# Patient Record
Sex: Female | Born: 1940 | Race: White | Hispanic: No | Marital: Married | Smoking: Former smoker
Health system: Southern US, Community
[De-identification: ages and names within clinical notes are randomized; demographics above are authoritative.]

## PROBLEM LIST (undated history)

## (undated) DIAGNOSIS — E039 Hypothyroidism, unspecified: Secondary | ICD-10-CM

## (undated) DIAGNOSIS — F1010 Alcohol abuse, uncomplicated: Secondary | ICD-10-CM

## (undated) DIAGNOSIS — Z85820 Personal history of malignant melanoma of skin: Secondary | ICD-10-CM

## (undated) DIAGNOSIS — E782 Mixed hyperlipidemia: Secondary | ICD-10-CM

## (undated) DIAGNOSIS — M1990 Unspecified osteoarthritis, unspecified site: Secondary | ICD-10-CM

## (undated) DIAGNOSIS — N183 Chronic kidney disease, stage 3 unspecified: Secondary | ICD-10-CM

## (undated) DIAGNOSIS — I872 Venous insufficiency (chronic) (peripheral): Secondary | ICD-10-CM

## (undated) DIAGNOSIS — C439 Malignant melanoma of skin, unspecified: Secondary | ICD-10-CM

## (undated) DIAGNOSIS — L659 Nonscarring hair loss, unspecified: Secondary | ICD-10-CM

## (undated) DIAGNOSIS — K589 Irritable bowel syndrome without diarrhea: Secondary | ICD-10-CM

## (undated) DIAGNOSIS — I10 Essential (primary) hypertension: Secondary | ICD-10-CM

## (undated) DIAGNOSIS — T7840XA Allergy, unspecified, initial encounter: Secondary | ICD-10-CM

## (undated) HISTORY — DX: Hypothyroidism, unspecified: E03.9

## (undated) HISTORY — DX: Alcohol abuse, uncomplicated: F10.10

## (undated) HISTORY — DX: Venous insufficiency (chronic) (peripheral): I87.2

## (undated) HISTORY — DX: Malignant melanoma of skin, unspecified: C43.9

## (undated) HISTORY — DX: Mixed hyperlipidemia: E78.2

## (undated) HISTORY — DX: Essential (primary) hypertension: I10

## (undated) HISTORY — DX: Nonscarring hair loss, unspecified: L65.9

## (undated) HISTORY — DX: Chronic kidney disease, stage 3 unspecified: N18.30

## (undated) HISTORY — DX: Personal history of malignant melanoma of skin: Z85.820

## (undated) HISTORY — DX: Unspecified osteoarthritis, unspecified site: M19.90

## (undated) HISTORY — DX: Allergy, unspecified, initial encounter: T78.40XA

## (undated) HISTORY — PX: ABDOMINAL HYSTERECTOMY: SHX81

## (undated) HISTORY — DX: Irritable bowel syndrome, unspecified: K58.9

---

## 1997-11-21 DIAGNOSIS — A770 Spotted fever due to Rickettsia rickettsii: Secondary | ICD-10-CM

## 1997-11-21 HISTORY — DX: Spotted fever due to Rickettsia rickettsii: A77.0

## 1998-11-21 DIAGNOSIS — M8580 Other specified disorders of bone density and structure, unspecified site: Secondary | ICD-10-CM

## 1998-11-21 HISTORY — DX: Other specified disorders of bone density and structure, unspecified site: M85.80

## 1999-11-26 ENCOUNTER — Other Ambulatory Visit: Admission: RE | Admit: 1999-11-26 | Discharge: 1999-11-26 | Payer: Self-pay | Admitting: *Deleted

## 2001-01-08 ENCOUNTER — Other Ambulatory Visit: Admission: RE | Admit: 2001-01-08 | Discharge: 2001-01-08 | Payer: Self-pay | Admitting: *Deleted

## 2005-11-21 DIAGNOSIS — K219 Gastro-esophageal reflux disease without esophagitis: Secondary | ICD-10-CM

## 2005-11-21 HISTORY — DX: Gastro-esophageal reflux disease without esophagitis: K21.9

## 2009-03-11 ENCOUNTER — Ambulatory Visit: Payer: Self-pay | Admitting: Family Medicine

## 2009-03-18 ENCOUNTER — Encounter: Admission: RE | Admit: 2009-03-18 | Discharge: 2009-03-18 | Payer: Self-pay | Admitting: Family Medicine

## 2009-04-16 ENCOUNTER — Ambulatory Visit: Payer: Self-pay | Admitting: Family Medicine

## 2009-07-20 ENCOUNTER — Ambulatory Visit: Payer: Self-pay | Admitting: Family Medicine

## 2011-03-24 ENCOUNTER — Encounter: Payer: Self-pay | Admitting: Family Medicine

## 2011-03-24 DIAGNOSIS — IMO0002 Reserved for concepts with insufficient information to code with codable children: Secondary | ICD-10-CM

## 2011-05-15 ENCOUNTER — Emergency Department (HOSPITAL_COMMUNITY): Payer: Medicare Other

## 2011-05-15 ENCOUNTER — Emergency Department (HOSPITAL_COMMUNITY)
Admission: EM | Admit: 2011-05-15 | Discharge: 2011-05-15 | Disposition: A | Discharge status: Home / Self Care | Payer: Medicare Other | Attending: Emergency Medicine | Admitting: Emergency Medicine

## 2011-05-15 DIAGNOSIS — IMO0001 Reserved for inherently not codable concepts without codable children: Secondary | ICD-10-CM | POA: Insufficient documentation

## 2011-05-15 DIAGNOSIS — R229 Localized swelling, mass and lump, unspecified: Secondary | ICD-10-CM | POA: Insufficient documentation

## 2011-05-15 DIAGNOSIS — S61509A Unspecified open wound of unspecified wrist, initial encounter: Secondary | ICD-10-CM | POA: Insufficient documentation

## 2011-05-15 DIAGNOSIS — Z23 Encounter for immunization: Secondary | ICD-10-CM | POA: Insufficient documentation

## 2011-05-15 DIAGNOSIS — E039 Hypothyroidism, unspecified: Secondary | ICD-10-CM | POA: Insufficient documentation

## 2011-05-15 DIAGNOSIS — I10 Essential (primary) hypertension: Secondary | ICD-10-CM | POA: Insufficient documentation

## 2012-09-12 ENCOUNTER — Other Ambulatory Visit: Payer: Self-pay | Admitting: Family Medicine

## 2012-09-12 ENCOUNTER — Ambulatory Visit
Admission: RE | Admit: 2012-09-12 | Discharge: 2012-09-12 | Disposition: A | Discharge status: Home / Self Care | Payer: Medicare Other | Source: Ambulatory Visit | Attending: Family Medicine | Admitting: Family Medicine

## 2012-09-12 DIAGNOSIS — R52 Pain, unspecified: Secondary | ICD-10-CM

## 2012-09-12 DIAGNOSIS — R202 Paresthesia of skin: Secondary | ICD-10-CM

## 2012-11-21 DIAGNOSIS — G5603 Carpal tunnel syndrome, bilateral upper limbs: Secondary | ICD-10-CM

## 2012-11-21 HISTORY — DX: Carpal tunnel syndrome, bilateral upper limbs: G56.03

## 2013-11-21 DIAGNOSIS — G629 Polyneuropathy, unspecified: Secondary | ICD-10-CM

## 2013-11-21 DIAGNOSIS — J449 Chronic obstructive pulmonary disease, unspecified: Secondary | ICD-10-CM

## 2013-11-21 DIAGNOSIS — F419 Anxiety disorder, unspecified: Secondary | ICD-10-CM

## 2013-11-21 HISTORY — DX: Polyneuropathy, unspecified: G62.9

## 2013-11-21 HISTORY — DX: Chronic obstructive pulmonary disease, unspecified: J44.9

## 2013-11-21 HISTORY — DX: Anxiety disorder, unspecified: F41.9

## 2014-11-21 DIAGNOSIS — F32A Depression, unspecified: Secondary | ICD-10-CM

## 2014-11-21 HISTORY — DX: Depression, unspecified: F32.A

## 2016-05-16 ENCOUNTER — Other Ambulatory Visit: Payer: Self-pay | Admitting: Family Medicine

## 2016-05-16 DIAGNOSIS — R519 Headache, unspecified: Secondary | ICD-10-CM

## 2016-05-16 DIAGNOSIS — R0600 Dyspnea, unspecified: Secondary | ICD-10-CM

## 2016-05-16 DIAGNOSIS — R51 Headache: Secondary | ICD-10-CM

## 2016-05-20 ENCOUNTER — Ambulatory Visit
Admission: RE | Admit: 2016-05-20 | Discharge: 2016-05-20 | Disposition: A | Discharge status: Home / Self Care | Payer: Medicare Other | Source: Ambulatory Visit | Attending: Family Medicine | Admitting: Family Medicine

## 2016-05-20 DIAGNOSIS — R0600 Dyspnea, unspecified: Secondary | ICD-10-CM

## 2016-05-20 DIAGNOSIS — R519 Headache, unspecified: Secondary | ICD-10-CM

## 2016-05-20 DIAGNOSIS — R51 Headache: Secondary | ICD-10-CM

## 2016-05-20 MED ORDER — GADOBENATE DIMEGLUMINE 529 MG/ML IV SOLN
15.0000 mL | Freq: Once | INTRAVENOUS | Status: AC | PRN
  Administered 2016-05-20: 15 mL via INTRAVENOUS

## 2017-02-01 ENCOUNTER — Ambulatory Visit (INDEPENDENT_AMBULATORY_CARE_PROVIDER_SITE_OTHER): Payer: Medicare Other

## 2017-02-01 ENCOUNTER — Ambulatory Visit (INDEPENDENT_AMBULATORY_CARE_PROVIDER_SITE_OTHER): Payer: Medicare Other | Admitting: Podiatry

## 2017-02-01 VITALS — BP 132/95 | HR 78

## 2017-02-01 DIAGNOSIS — M722 Plantar fascial fibromatosis: Secondary | ICD-10-CM

## 2017-02-01 DIAGNOSIS — M21619 Bunion of unspecified foot: Secondary | ICD-10-CM | POA: Diagnosis not present

## 2017-02-01 NOTE — Patient Instructions (Signed)
Hallux Rigidus Hallux rigidus is a type of joint pain or joint disease (arthritis) that affects your big toe (hallux). This condition involves the joint that connects the base of your big toe to the main part of your foot (metatarsophalangeal joint). This condition can cause your big toe to become stiff, painful, and difficult to move. Symptoms may get worse with movement or in cold or damp weather. The condition also gets worse over time. What are the causes? This condition may be caused by having a foot that does not function the way that it should or has an abnormal shape (structural deformity). These foot problems can run in families (be hereditary). This condition can also be caused by:  Injury.  Overuse.  Certain inflammatory diseases, including gout and rheumatoid arthritis. What increases the risk? This condition is more likely to develop in people who:  Have a foot bone (metatarsal) that is longer or higher than normal.  Have a family history of hallux rigidus.  Have previously injured their big toe.  Have feet that do not have a curve (arch) on the inner side of the foot. This may be called flat feet or fallen arches.  Turn their ankles in when they walk (pronation).  Have rheumatoid arthritis or gout.  Have to stoop down often at work. What are the signs or symptoms? Symptoms of this condition include:  Big toe pain.  Stiffness and difficulty moving the big toe.  Swelling of the toe and surrounding area.  Bone spurs. These are bony growths that can form on the joint of the big toe.  A limp. How is this diagnosed? This condition is diagnosed based on a medical history and physical exam. This may include X-rays. How is this treated? Treatment for this condition includes:  Wearing roomy, comfortable shoes that have a large toe box.  Putting orthotic devices in your shoes.  Pain medicines.  Physical therapy.  Icing the injured area.  Alternate between  putting your foot in cold water then warm water. If your condition is severe, treatment may include:  Corticosteroid injections to relieve pain.  Surgery to remove bone spurs, fuse damaged bones together, or replace the entire joint. Follow these instructions at home:  Take over-the-counter and prescription medicines only as told by your health care provider.  Do not wear high heels or other restrictive footwear. Wear comfortable, supportive shoes that have a large toe box.  Wear orthotics as told by your health care provider, if this applies.  Put your feet in cold water for 30 seconds, then in warm water for 30 seconds. Alternate between the cold and warm water for 5 minutes. Do this several times a day or as told by your health care provider.  If directed, apply ice to the injured area.  Put ice in a plastic bag.  Place a towel between your skin and the bag.  Leave the ice on for 20 minutes, 2-3 times per day.  Do foot exercises as instructed by your health care provider or a physical therapist.  Keep all follow-up visits as told by your health care provider. This is important. Contact a health care provider if:  You notice bone spurs or growths on or around your big toe.  Your pain does not get better or it gets worse.  You have pain while resting.  You have pain in other parts of your body, such as your back, hip, or knee.  You start to limp. This information is not intended  to replace advice given to you by your health care provider. Make sure you discuss any questions you have with your health care provider. Document Released: 11/07/2005 Document Revised: 04/14/2016 Document Reviewed: 07/15/2015 [employer] Interactive Patient Education  2017 [employer]  Plantar Fasciitis (Heel Spur Syndrome) with Rehab The plantar fascia is a fibrous, ligament-like, soft-tissue structure that spans the bottom of the foot. Plantar fasciitis is a condition that causes pain in the  foot due to inflammation of the tissue. SYMPTOMS   Pain and tenderness on the underneath side of the foot.  Pain that worsens with standing or walking. CAUSES  Plantar fasciitis is caused by irritation and injury to the plantar fascia on the underneath side of the foot. Common mechanisms of injury include:  Direct trauma to bottom of the foot.  Damage to a small nerve that runs under the foot where the main fascia attaches to the heel bone.  Stress placed on the plantar fascia due to bone spurs. RISK INCREASES WITH:   Activities that place stress on the plantar fascia (running, jumping, pivoting, or cutting).  Poor strength and flexibility.  Improperly fitted shoes.  Tight calf muscles.  Flat feet.  Failure to warm-up properly before activity.  Obesity. PREVENTION  Warm up and stretch properly before activity.  Allow for adequate recovery between workouts.  Maintain physical fitness:  Strength, flexibility, and endurance.  Cardiovascular fitness.  Maintain a health body weight.  Avoid stress on the plantar fascia.  Wear properly fitted shoes, including arch supports for individuals who have flat feet.  PROGNOSIS  If treated properly, then the symptoms of plantar fasciitis usually resolve without surgery. However, occasionally surgery is necessary.  RELATED COMPLICATIONS   Recurrent symptoms that may result in a chronic condition.  Problems of the lower back that are caused by compensating for the injury, such as limping.  Pain or weakness of the foot during push-off following surgery.  Chronic inflammation, scarring, and partial or complete fascia tear, occurring more often from repeated injections.  TREATMENT  Treatment initially involves the use of ice and medication to help reduce pain and inflammation. The use of strengthening and stretching exercises may help reduce pain with activity, especially stretches of the Achilles tendon. These exercises may  be performed at home or with a therapist. Your caregiver may recommend that you use heel cups of arch supports to help reduce stress on the plantar fascia. Occasionally, corticosteroid injections are given to reduce inflammation. If symptoms persist for greater than 6 months despite non-surgical (conservative), then surgery may be recommended.   MEDICATION   If pain medication is necessary, then nonsteroidal anti-inflammatory medications, such as aspirin and ibuprofen, or other minor pain relievers, such as acetaminophen, are often recommended.  Do not take pain medication within 7 days before surgery.  Prescription pain relievers may be given if deemed necessary by your caregiver. Use only as directed and only as much as you need.  Corticosteroid injections may be given by your caregiver. These injections should be reserved for the most serious cases, because they may only be given a certain number of times.  HEAT AND COLD  Cold treatment (icing) relieves pain and reduces inflammation. Cold treatment should be applied for 10 to 15 minutes every 2 to 3 hours for inflammation and pain and immediately after any activity that aggravates your symptoms. Use ice packs or massage the area with a piece of ice (ice massage).  Heat treatment may be used prior to performing the stretching  and strengthening activities prescribed by your caregiver, physical therapist, or athletic trainer. Use a heat pack or soak the injury in warm water.  SEEK IMMEDIATE MEDICAL CARE IF:  Treatment seems to offer no benefit, or the condition worsens.  Any medications produce adverse side effects.  EXERCISES- RANGE OF MOTION (ROM) AND STRETCHING EXERCISES - Plantar Fasciitis (Heel Spur Syndrome) These exercises may help you when beginning to rehabilitate your injury. Your symptoms may resolve with or without further involvement from your physician, physical therapist or athletic trainer. While completing these exercises,  remember:   Restoring tissue flexibility helps normal motion to return to the joints. This allows healthier, less painful movement and activity.  An effective stretch should be held for at least 30 seconds.  A stretch should never be painful. You should only feel a gentle lengthening or release in the stretched tissue.  RANGE OF MOTION - Toe Extension, Flexion  Sit with your right / left leg crossed over your opposite knee.  Grasp your toes and gently pull them back toward the top of your foot. You should feel a stretch on the bottom of your toes and/or foot.  Hold this stretch for 10 seconds.  Now, gently pull your toes toward the bottom of your foot. You should feel a stretch on the top of your toes and or foot.  Hold this stretch for 10 seconds. Repeat  times. Complete this stretch 3 times per day.   RANGE OF MOTION - Ankle Dorsiflexion, Active Assisted  Remove shoes and sit on a chair that is preferably not on a carpeted surface.  Place right / left foot under knee. Extend your opposite leg for support.  Keeping your heel down, slide your right / left foot back toward the chair until you feel a stretch at your ankle or calf. If you do not feel a stretch, slide your bottom forward to the edge of the chair, while still keeping your heel down.  Hold this stretch for 10 seconds. Repeat 3 times. Complete this stretch 2 times per day.   STRETCH  Gastroc, Standing  Place hands on wall.  Extend right / left leg, keeping the front knee somewhat bent.  Slightly point your toes inward on your back foot.  Keeping your right / left heel on the floor and your knee straight, shift your weight toward the wall, not allowing your back to arch.  You should feel a gentle stretch in the right / left calf. Hold this position for 10 seconds. Repeat 3 times. Complete this stretch 2 times per day.  STRETCH  Soleus, Standing  Place hands on wall.  Extend right / left leg, keeping the other  knee somewhat bent.  Slightly point your toes inward on your back foot.  Keep your right / left heel on the floor, bend your back knee, and slightly shift your weight over the back leg so that you feel a gentle stretch deep in your back calf.  Hold this position for 10 seconds. Repeat 3 times. Complete this stretch 2 times per day.  STRETCH  Gastrocsoleus, Standing  Note: This exercise can place a lot of stress on your foot and ankle. Please complete this exercise only if specifically instructed by your caregiver.   Place the ball of your right / left foot on a step, keeping your other foot firmly on the same step.  Hold on to the wall or a rail for balance.  Slowly lift your other foot, allowing your body  weight to press your heel down over the edge of the step.  You should feel a stretch in your right / left calf.  Hold this position for 10 seconds.  Repeat this exercise with a slight bend in your right / left knee. Repeat 3 times. Complete this stretch 2 times per day.   STRENGTHENING EXERCISES - Plantar Fasciitis (Heel Spur Syndrome)  These exercises may help you when beginning to rehabilitate your injury. They may resolve your symptoms with or without further involvement from your physician, physical therapist or athletic trainer. While completing these exercises, remember:   Muscles can gain both the endurance and the strength needed for everyday activities through controlled exercises.  Complete these exercises as instructed by your physician, physical therapist or athletic trainer. Progress the resistance and repetitions only as guided.  STRENGTH - Towel Curls  Sit in a chair positioned on a non-carpeted surface.  Place your foot on a towel, keeping your heel on the floor.  Pull the towel toward your heel by only curling your toes. Keep your heel on the floor. Repeat 3 times. Complete this exercise 2 times per day.  STRENGTH - Ankle Inversion  Secure one end of a  rubber exercise band/tubing to a fixed object (table, pole). Loop the other end around your foot just before your toes.  Place your fists between your knees. This will focus your strengthening at your ankle.  Slowly, pull your big toe up and in, making sure the band/tubing is positioned to resist the entire motion.  Hold this position for 10 seconds.  Have your muscles resist the band/tubing as it slowly pulls your foot back to the starting position. Repeat 3 times. Complete this exercises 2 times per day.  Document Released: 11/07/2005 Document Revised: 01/30/2012 Document Reviewed: 02/19/2009 [facility] Patient Information 2014 [place], [place].

## 2017-02-03 NOTE — Progress Notes (Signed)
Subjective:     Patient ID: Yesenia Washington, female   DOB: [birth year], 76 y.o.   MRN: [id number]  HPI patient presents with painful heel pain and also has structural bunion deformity. States it's been present for several months and is getting worse and the bunions been present for a long time and she's tried wider shoes and soaks   Review of Systems  All other systems reviewed and are negative.      Objective:   Physical Exam  Constitutional: She is oriented to person, place, and time.  Cardiovascular: Intact distal pulses.   Musculoskeletal: Normal range of motion.  Neurological: She is oriented to person, place, and time.  Skin: Skin is warm.  Nursing note and vitals reviewed.  neurovascular status intact muscle strength adequate with patient found have structural bunion deformity with pain and redness and also is noted to have exquisite discomfort in the heel at the insertional point tendon into the calcaneus. Patient is moderate flatfoot deformity does have good digital perfusion and is well oriented 3     Assessment:     Inflammatory fasciitis with bunion deformity noted    Plan:     H&P both conditions discussed and education rendered. Reviewed x-rays and today I injected the plantar fascia 3 mg Kenalog 5 mg Xylocaine and applied fascial brace with instructions on usage. Gave instructions on physical therapy and also shoe gear modifications and discussed possible bunion correction  X-ray report indicates that there is elevation between the first and second metatarsal of approximate 14 with patient noted to have small spur formation plantar heel with no indication stress fracture advanced arthritis

## 2017-02-13 ENCOUNTER — Encounter: Payer: Self-pay | Admitting: Podiatry

## 2017-02-13 ENCOUNTER — Ambulatory Visit (INDEPENDENT_AMBULATORY_CARE_PROVIDER_SITE_OTHER): Payer: Medicare Other | Admitting: Podiatry

## 2017-02-13 DIAGNOSIS — M722 Plantar fascial fibromatosis: Secondary | ICD-10-CM

## 2017-02-13 DIAGNOSIS — M2040 Other hammer toe(s) (acquired), unspecified foot: Secondary | ICD-10-CM

## 2017-02-13 DIAGNOSIS — M21619 Bunion of unspecified foot: Secondary | ICD-10-CM

## 2017-02-13 MED ORDER — ONDANSETRON HCL 4 MG PO TABS: 4.0000 mg | ORAL_TABLET | Freq: Three times a day (TID) | ORAL | 0 refills | Status: DC | PRN

## 2017-02-13 MED ORDER — OXYCODONE-ACETAMINOPHEN 10-325 MG PO TABS: 1.0000 | ORAL_TABLET | ORAL | 0 refills | Status: DC | PRN

## 2017-02-13 NOTE — Patient Instructions (Signed)

## 2017-02-14 ENCOUNTER — Encounter: Payer: Self-pay | Admitting: Podiatry

## 2017-02-14 DIAGNOSIS — M722 Plantar fascial fibromatosis: Secondary | ICD-10-CM | POA: Diagnosis not present

## 2017-02-14 DIAGNOSIS — M2041 Other hammer toe(s) (acquired), right foot: Secondary | ICD-10-CM | POA: Diagnosis not present

## 2017-02-14 DIAGNOSIS — M2021 Hallux rigidus, right foot: Secondary | ICD-10-CM | POA: Diagnosis not present

## 2017-02-14 NOTE — Progress Notes (Addendum)
In reviewing patient on 02-13-17 I discovered inconsistencies from the note of 314. 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 In reviewing this with her we do agree that a implant arthroplasty will be necessary due to the advancement of condition and the note of 3-14 was not reflective of this. I have also recommended digital fusions. 02-13-17  patient presents on this date with pain and deformity of the first metatarsal phalangeal joint with severe spurring formation and complete loss of motion with pain around the entire joint surface. Patient did have previous failed surgery on the first metatarsal and also presents with significant digital deformities of digits 23 with elevation redness of the proximal phalanx and failure to respond to trimming and padding. Pain in the heel present but improved. 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 I at this time discussed treatment options and she wants surgery and I allowed her to read consent form going over alternative treatments complications and the fact that this is revisional surgery and that there is no long-term guarantees and that she does have severe deformity. Patient wants surgery and after extensive review signs of understanding all complications and difficulty of procedure and will have a total implant arthroplasty along with digital fusion of digits 2 and 3 right with external K wire an injection of the right heel. Patient has air fracture walker dispensed with all instructions on usage and is scheduled for outpatient surgery and is encouraged to call with questions.   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  In reviewing this note it was noted that while she has severe progressive arthritis and she had not had previous surgery on the first MPJ.  Patient did have progressive deformity with narrowing of the joint surface advanced arthritis and spur formation but absence of a previous surgical procedure on the first metatarsal right foot

## 2017-02-15 ENCOUNTER — Telehealth: Payer: Self-pay | Admitting: *Deleted

## 2017-02-15 NOTE — Telephone Encounter (Signed)
Pt states she did not have ice packs in her surgery bag, and did not get from surgical center. I spoke with pt she is using frozen corn, but is not certain getting proper icing. I told pt that our ice packs were gel but had the same cooling properties as the frozen corn and to place behind the knee.I offered pt to have her husband pick up 2 ice packs in the [place] office, and she states she doesn't want to inconvenience him further. Pt states she is placing on the side of the knee because that is the side of the surgery foot that hurts. I told her that was fine and instructed her to not be weight bearing or dangle the foot more than 15 mins/hour.

## 2017-02-24 ENCOUNTER — Ambulatory Visit (INDEPENDENT_AMBULATORY_CARE_PROVIDER_SITE_OTHER): Payer: Self-pay | Admitting: Podiatry

## 2017-02-24 ENCOUNTER — Ambulatory Visit (INDEPENDENT_AMBULATORY_CARE_PROVIDER_SITE_OTHER): Payer: Medicare Other

## 2017-02-24 DIAGNOSIS — M2040 Other hammer toe(s) (acquired), unspecified foot: Secondary | ICD-10-CM

## 2017-02-24 DIAGNOSIS — M21619 Bunion of unspecified foot: Secondary | ICD-10-CM

## 2017-02-24 MED ORDER — OXYCODONE-ACETAMINOPHEN 10-325 MG PO TABS: 1.0000 | ORAL_TABLET | ORAL | 0 refills | Status: DC | PRN

## 2017-02-25 NOTE — Progress Notes (Signed)
Subjective: Yesenia Washington is a 76 y.o. is seen today in office s/p right Jake Michaelis bunion with implant and hammertoe repair preformed on 02/14/17. They state their pain is improving but she is still taking percocet as needed. She is still wearing the CAM boot and is asking to come out of it today. Denies any systemic complaints such as fevers, chills, nausea, vomiting. No calf pain, chest pain, shortness of breath.   Objective: General: No acute distress, AAOx3  DP/PT pulses palpable 2/4, CRT < 3 sec to all digits.  Protective sensation intact. Motor function intact.  Right foot: Incision is well coapted without any evidence of dehiscence and suture intact. There is no surrounding erythema, ascending cellulitis, fluctuance, crepitus, malodor, drainage/purulence. There is mild edema around the surgical site. There is mild pain along the surgical site. Toes are in a rectus position. There are no clinical signs of infection.  No other areas of tenderness to bilateral lower extremities.  No other open lesions or pre-ulcerative lesions.  No pain with calf compression, swelling, warmth, erythema.   Assessment and Plan:  Status post right foot surgery, doing well with no complications   -Treatment options discussed including all alternatives, risks, and complications -X-rays were obtained and reviewed with the patient. Status post first MPJ arthroplasty with implant. Implant is not seated flush and appears to be penetrating the cortex of the phalanx however it appears to be intact. -Recommend continue with the cam boot at all times. She was disappointed today about this however encouraged her stay in the cam boot. -Ice/elevation -Pain medication as needed. Refilled today. -Monitor for any clinical signs or symptoms of infection and DVT/PE and directed to call the office immediately should any occur or go to the ER. -Follow-up in 1 week or sooner if any problems arise. In the meantime, encouraged to call  the office with any questions, concerns, change in symptoms.   Celesta Gentile, DPM

## 2017-03-03 ENCOUNTER — Ambulatory Visit (INDEPENDENT_AMBULATORY_CARE_PROVIDER_SITE_OTHER): Payer: Medicare Other

## 2017-03-03 ENCOUNTER — Ambulatory Visit (INDEPENDENT_AMBULATORY_CARE_PROVIDER_SITE_OTHER): Payer: Medicare Other | Admitting: Podiatry

## 2017-03-03 DIAGNOSIS — M21619 Bunion of unspecified foot: Secondary | ICD-10-CM

## 2017-03-03 DIAGNOSIS — M2040 Other hammer toe(s) (acquired), unspecified foot: Secondary | ICD-10-CM | POA: Diagnosis not present

## 2017-03-05 NOTE — Progress Notes (Signed)
Subjective:     Patient ID: Yesenia Washington, female   DOB: 16-Mar-1941, 76 y.o.   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 Also had had digital fusion digits 23 with the second pin moving in a distal direction    Assessment:     Doing well clinically postoperatively with movement of the second pin with good range of motion first MPJ with no crepitus or pain noted    Plan:     H&P x-ray reviewed and I went ahead and remove the pin from the second digit and applied Band-Aid was sterile dressing to the end of the toe. I reviewed with her the fact that there has been some movement of the implant but I'm relatively confident will function well in the position it is and I will just have to watch it with the possibility may need to be removed in future and realigned or other procedure done but right now clinically she is functioning very well  X-rays indicate that there has been some malposition of the implant on the distal side due to the sclerosis of her bone but I do believe clinically is functioning well I don't think it's given to give her any issues but we'll need to be watched

## 2017-03-17 ENCOUNTER — Ambulatory Visit (INDEPENDENT_AMBULATORY_CARE_PROVIDER_SITE_OTHER): Payer: Medicare Other | Admitting: Podiatry

## 2017-03-17 ENCOUNTER — Encounter: Payer: Self-pay | Admitting: Podiatry

## 2017-03-17 ENCOUNTER — Ambulatory Visit (INDEPENDENT_AMBULATORY_CARE_PROVIDER_SITE_OTHER): Payer: Medicare Other

## 2017-03-17 VITALS — Resp 16

## 2017-03-17 DIAGNOSIS — M21619 Bunion of unspecified foot: Secondary | ICD-10-CM | POA: Diagnosis not present

## 2017-03-17 DIAGNOSIS — M2040 Other hammer toe(s) (acquired), unspecified foot: Secondary | ICD-10-CM

## 2017-03-20 NOTE — Progress Notes (Signed)
Subjective:    Patient ID: Yesenia Washington, female   DOB: 76 y.o.   MRN: [id number]   HPI patient states that she's not having a lot of pain but she like to get this pin removed in the third toe and she gets some swelling in the second toe    ROS      Objective:  Physical Exam Neurovascular status intact negative Homan sign was noted with wound edges well coapted hallux second third toes right with crusted tissue on the second toe which is bothersome to her. Pin is in place third digit and range of motion of the first MPJ is good    Assessment:    Doing well post implant right first MPJ despite the fact the bone was very hard and I was unable to get a press-fit and good alignment of the second and third toes     Plan:    Pin removed third digit right sterile dressing applied removed scab tissue from the second digit which gave her relief and discussed continued range of motion exercises and that we'll have to continue to monitor the first MPJ  X-rays indicate that there is some looseness of the first implant first MPJ but I do think it will function fine at this position and over time should heal in and I do not believe it will give her any long-term problems. Second and third toes with good and she will be monitored closely over the next several months

## 2017-03-31 NOTE — Progress Notes (Unsigned)
DOS 02/14/2017 Bunionectomy with implant procedure 1st right MPT: Fusion with pin 2nd and 3rd toes right: injection right heel

## 2017-04-28 ENCOUNTER — Encounter: Payer: Self-pay | Admitting: Podiatry

## 2017-04-28 ENCOUNTER — Ambulatory Visit (INDEPENDENT_AMBULATORY_CARE_PROVIDER_SITE_OTHER): Payer: Self-pay | Admitting: Podiatry

## 2017-04-28 ENCOUNTER — Ambulatory Visit (INDEPENDENT_AMBULATORY_CARE_PROVIDER_SITE_OTHER): Payer: Medicare Other

## 2017-04-28 ENCOUNTER — Ambulatory Visit: Payer: Medicare Other

## 2017-04-28 DIAGNOSIS — M21619 Bunion of unspecified foot: Secondary | ICD-10-CM

## 2017-04-28 NOTE — Progress Notes (Signed)
Subjective:    Patient ID: Yesenia Washington, female   DOB: 76 y.o.   MRN: [id number]   HPI patient states the big toes doing great and she's got some numbness in the second and third toes with occasional discomfort    ROS      Objective:  Physical Exam neurovascular status intact with adequate motion of the first MPJ right with no crepitus of the joint and the second and third digits and good alignment with mild elevation second toes the third toe and hallux do deviate slightly towards     Assessment:    Overall doing fine but is having some irritation of the second digit which should be short-term     Plan:   H&P applied padding to the second toe reviewed x-rays and patient can gradually increase activities and shoe gear and the symptoms should slowly go away over time. Reappoint 8 weeks or earlier if needed  X-ray indicates that the osteotomies are healing well and the implant is seated slightly of position but in a satisfactory position

## 2017-06-28 ENCOUNTER — Encounter: Payer: Self-pay | Admitting: Podiatry

## 2017-06-28 ENCOUNTER — Ambulatory Visit (INDEPENDENT_AMBULATORY_CARE_PROVIDER_SITE_OTHER): Payer: Medicare Other | Admitting: Podiatry

## 2017-06-28 ENCOUNTER — Ambulatory Visit (INDEPENDENT_AMBULATORY_CARE_PROVIDER_SITE_OTHER): Payer: Medicare Other

## 2017-06-28 DIAGNOSIS — M21619 Bunion of unspecified foot: Secondary | ICD-10-CM

## 2017-06-28 DIAGNOSIS — M722 Plantar fascial fibromatosis: Secondary | ICD-10-CM | POA: Diagnosis not present

## 2017-06-28 DIAGNOSIS — M2040 Other hammer toe(s) (acquired), unspecified foot: Secondary | ICD-10-CM

## 2017-06-28 MED ORDER — TRIAMCINOLONE ACETONIDE 10 MG/ML IJ SUSP
10.0000 mg | Freq: Once | INTRAMUSCULAR | Status: AC
Start: 2017-06-28 — End: 2017-06-28
  Administered 2017-06-28: 10 mg

## 2017-06-28 NOTE — Progress Notes (Signed)
Subjective:    Patient ID: Yesenia Washington, female   DOB: 76 y.o.   MRN: [id number]   HPI patient states she's feeling quite a bit better but still getting some pain in the bottom of her heel on the outside and she is due to go to [place] walking    ROS      Objective:  Physical Exam neurovascular status intact with patient doing well on the first MPJ right with no pain with second and third digits healing well with mild edema in the second digit but overall continues to improve with pain in the plantar heel lateral side     Assessment:   Doing well post surgery right foot with mild edema still noted with fasciitis right     Plan:    H&P condition reviewed and careful lateral injection administered 3 mg Kenalog 5 mill grams Xylocaine and instructed on physical therapy. Reappoint to recheck again when she returns from her trip  X-rays indicate the implant is not in perfect position but it's functioning well where it is and the digits are healing well

## 2017-08-31 ENCOUNTER — Ambulatory Visit (INDEPENDENT_AMBULATORY_CARE_PROVIDER_SITE_OTHER): Payer: Medicare Other | Admitting: Podiatry

## 2017-08-31 ENCOUNTER — Encounter: Payer: Self-pay | Admitting: Podiatry

## 2017-08-31 ENCOUNTER — Ambulatory Visit (INDEPENDENT_AMBULATORY_CARE_PROVIDER_SITE_OTHER): Payer: Medicare Other

## 2017-08-31 DIAGNOSIS — M779 Enthesopathy, unspecified: Secondary | ICD-10-CM | POA: Diagnosis not present

## 2017-08-31 DIAGNOSIS — M21619 Bunion of unspecified foot: Secondary | ICD-10-CM

## 2017-08-31 NOTE — Progress Notes (Signed)
Subjective:    Patient ID: Yesenia Washington, female   DOB: 76 y.o.   MRN: [id number]   HPI patient states she is improving but she still can get some swelling and pain in her foot but admits she's not been compressing    ROS      Objective:  Physical Exam neurovascular status intact with continued edema in the right forefoot with mild swelling noted with digits actually doing much better with diminished redness and first MPJ range of motion good with no pain or crepitus when moved     Assessment:    Continuing to have moderate inflammatory condition but it is improving and she has not been wearing compression     Plan:    Dispensed ankle compression stocking and instructed on importance of this and also elevation and rigid shoe usage. Patient will be seen back as needed  X-ray indicates that the implant is in place toes are recently good alignment with no indications of other pathology

## 2017-09-06 ENCOUNTER — Ambulatory Visit: Payer: Medicare Other | Admitting: Podiatry

## 2017-12-01 ENCOUNTER — Ambulatory Visit (INDEPENDENT_AMBULATORY_CARE_PROVIDER_SITE_OTHER): Payer: Medicare Other

## 2017-12-01 ENCOUNTER — Encounter: Payer: Self-pay | Admitting: Podiatry

## 2017-12-01 ENCOUNTER — Ambulatory Visit: Payer: Medicare Other | Admitting: Podiatry

## 2017-12-01 DIAGNOSIS — M2011 Hallux valgus (acquired), right foot: Secondary | ICD-10-CM | POA: Diagnosis not present

## 2017-12-01 DIAGNOSIS — M779 Enthesopathy, unspecified: Secondary | ICD-10-CM | POA: Diagnosis not present

## 2017-12-01 MED ORDER — TRIAMCINOLONE ACETONIDE 10 MG/ML IJ SUSP
10.0000 mg | Freq: Once | INTRAMUSCULAR | Status: AC
  Administered 2017-12-01: 10 mg

## 2017-12-01 NOTE — Progress Notes (Signed)
Subjective:   Patient ID: Yesenia Washington, female   DOB: 77 y.o.   MRN: [id number]   HPI Patient presents concerned about pain still in her right foot after surgery 9 months ago. Patient states she had a second opinion and she was concerned about her second digit and states that she does not have pain in her big toe joint but feels like it's in her forefoot around the second digit   ROS      Objective:  Physical Exam  Neurovascular status was found to be intact with patient having a well-healed surgical site right first metatarsal and second and third digits. I did not note any excessive erythema edema at the current time and upon palpation I found that the pain is mostly centered around the second metatarsophalangeal joint right and the second toe does have stress on it from the big toe and third toe but does not have excessive elevation at the current time or inflammatory process     Assessment:  Based on her presentation today it appears her pain is more in the second metatarsophalangeal joint versus the interphalangeal joint of the second digit     Plan:  H&P and x-ray reviewed. I did explain that with the implant procedure that you can get excess pressure on the second MPJ which can create inflammation and I would like to focus on this joint at the current time. I today went ahead and I did a proximal nerve block around the second MPJ and then using sterile procedure I aspirated the second MPJ getting out of small amount of clear fluid and injected the second MPJ with a quarter cc of dexamethasone Kenalog and applied thick plantar padding to reduce pressure against the joint surface. I did state at one point in future it may be possible to shorten the second metatarsal depending on response but we may be good with medication and orthotics. Reappoint to recheck again in 2 weeks  X-rays indicate the implant is in satisfactory position and based on this current x-ray it does appear there is  fusion of the second digit right at the interphalangeal joint. I did review findings with Dr. Jacqualyn Posey who concurred and there is some pathology of the second and third metatarsophalangeal joints with medial displacement of the digits

## 2017-12-05 ENCOUNTER — Telehealth: Payer: Self-pay | Admitting: Podiatry

## 2017-12-05 NOTE — Telephone Encounter (Signed)
Called pt to let her know that her requested medical records were ready for her to pick up at our front office at her convenience.

## 2017-12-15 ENCOUNTER — Ambulatory Visit: Payer: Medicare Other | Admitting: Podiatry

## 2017-12-25 ENCOUNTER — Ambulatory Visit: Payer: Medicare Other | Admitting: Podiatry

## 2017-12-25 ENCOUNTER — Encounter: Payer: Self-pay | Admitting: Podiatry

## 2017-12-25 DIAGNOSIS — M779 Enthesopathy, unspecified: Secondary | ICD-10-CM

## 2017-12-27 NOTE — Progress Notes (Signed)
Subjective:   Patient ID: Yesenia Washington, female   DOB: 77 y.o.   MRN: [id number]   HPI Patient presents stating that she did have improvement with the medication and it does not seem as bad as it was previously but she is having pain again in her joint.   ROS      Objective:  Physical Exam  Neurovascular status intact with inflammation present at the second metatarsal phalangeal joint with fluid buildup of a mild nature within the joint itself.  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  At this time we are going to make her a orthotic to try to reduce the weightbearing forces on the second MPJ and also I went over wearing his rigid bottom shoes as possible.  Educated her on this and patient will be seen back for casting with Liliane Channel on Thursday

## 2017-12-29 ENCOUNTER — Ambulatory Visit (INDEPENDENT_AMBULATORY_CARE_PROVIDER_SITE_OTHER): Payer: Medicare Other | Admitting: Podiatry

## 2017-12-29 ENCOUNTER — Ambulatory Visit (INDEPENDENT_AMBULATORY_CARE_PROVIDER_SITE_OTHER): Payer: Medicare Other | Admitting: Orthotics

## 2017-12-29 ENCOUNTER — Ambulatory Visit: Payer: Medicare Other | Admitting: Podiatry

## 2017-12-29 DIAGNOSIS — M722 Plantar fascial fibromatosis: Secondary | ICD-10-CM

## 2017-12-29 DIAGNOSIS — M779 Enthesopathy, unspecified: Secondary | ICD-10-CM

## 2017-12-29 DIAGNOSIS — M2011 Hallux valgus (acquired), right foot: Secondary | ICD-10-CM

## 2017-12-31 NOTE — Progress Notes (Signed)
Subjective:   Patient ID: Yesenia Washington, female   DOB: 77 y.o.   MRN: [id number]   HPI Patient presents to see ped orthotist for orthotic scanning   ROS      Objective:  Physical Exam  Continued complaint of inflammation second metatarsal phalangeal joint right     Assessment:  Probable capsulitis second MPJ right     Plan:  Casted for orthotics and discussed the continued importance of more rigid bottom shoes

## 2018-01-14 NOTE — Progress Notes (Signed)
Patient came into today to be cast for [facility]. Upon recommendation of Dr. Paulla Dolly Patient presents with sub 2 capsulitis R Goals are longitudinal support and offload 2 cap R Plan vendor [place]

## 2018-01-23 ENCOUNTER — Other Ambulatory Visit: Payer: Medicare Other

## 2018-02-21 ENCOUNTER — Emergency Department (HOSPITAL_COMMUNITY)
Admission: EM | Admit: 2018-02-21 | Discharge: 2018-02-23 | Disposition: A | Discharge status: Other Acute Inpatient Hospital | Payer: Medicare Other | Attending: Emergency Medicine | Admitting: Emergency Medicine

## 2018-02-21 DIAGNOSIS — F322 Major depressive disorder, single episode, severe without psychotic features: Secondary | ICD-10-CM | POA: Diagnosis not present

## 2018-02-21 DIAGNOSIS — Z046 Encounter for general psychiatric examination, requested by authority: Secondary | ICD-10-CM | POA: Insufficient documentation

## 2018-02-21 DIAGNOSIS — Z01818 Encounter for other preprocedural examination: Secondary | ICD-10-CM

## 2018-02-21 DIAGNOSIS — Z85820 Personal history of malignant melanoma of skin: Secondary | ICD-10-CM | POA: Insufficient documentation

## 2018-02-21 DIAGNOSIS — Z87891 Personal history of nicotine dependence: Secondary | ICD-10-CM | POA: Diagnosis not present

## 2018-02-21 DIAGNOSIS — R45851 Suicidal ideations: Secondary | ICD-10-CM | POA: Insufficient documentation

## 2018-02-21 DIAGNOSIS — E039 Hypothyroidism, unspecified: Secondary | ICD-10-CM | POA: Insufficient documentation

## 2018-02-21 DIAGNOSIS — Z79899 Other long term (current) drug therapy: Secondary | ICD-10-CM | POA: Diagnosis not present

## 2018-02-21 DIAGNOSIS — F329 Major depressive disorder, single episode, unspecified: Secondary | ICD-10-CM | POA: Diagnosis present

## 2018-02-21 LAB — ETHANOL: ALCOHOL ETHYL (B): 137 mg/dL — AB (ref <10)

## 2018-02-21 LAB — BASIC METABOLIC PANEL
Anion gap: 11 (ref 5–15)
BUN: 17 mg/dL (ref 6–20)
CALCIUM: 9.2 mg/dL (ref 8.9–10.3)
CO2: 23 mmol/L (ref 22–32)
Chloride: 107 mmol/L (ref 101–111)
Creatinine, Ser: 0.81 mg/dL (ref 0.44–1.00)
GFR calc Af Amer: >60 mL/min (ref >60)
GFR calc non Af Amer: >60 mL/min (ref >60)
GLUCOSE: 104 mg/dL — AB (ref 65–99)
Potassium: 3.5 mmol/L (ref 3.5–5.1)
SODIUM: 141 mmol/L (ref 135–145)

## 2018-02-21 LAB — RAPID URINE DRUG SCREEN, HOSP PERFORMED
Amphetamines: NOT DETECTED
Barbiturates: NOT DETECTED
Benzodiazepines: POSITIVE — AB
Cocaine: NOT DETECTED
Opiates: NOT DETECTED
Tetrahydrocannabinol: NOT DETECTED

## 2018-02-21 LAB — CBC WITH DIFFERENTIAL/PLATELET
BASOS ABS: 0.1 10*3/uL (ref 0.0–0.1)
Basophils Relative: 1 %
EOS ABS: 0.3 10*3/uL (ref 0.0–0.7)
EOS PCT: 4 %
HCT: 42.5 % (ref 36.0–46.0)
Hemoglobin: 13.8 g/dL (ref 12.0–15.0)
LYMPHS ABS: 2.3 10*3/uL (ref 0.7–4.0)
Lymphocytes Relative: 30 %
MCH: 33.7 pg (ref 26.0–34.0)
MCHC: 32.5 g/dL (ref 30.0–36.0)
MCV: 103.9 fL — AB (ref 78.0–100.0)
MONO ABS: 0.6 10*3/uL (ref 0.1–1.0)
Monocytes Relative: 7 %
Neutro Abs: 4.6 10*3/uL (ref 1.7–7.7)
Neutrophils Relative %: 58 %
PLATELETS: 241 10*3/uL (ref 150–400)
RBC: 4.09 MIL/uL (ref 3.87–5.11)
RDW: 13.4 % (ref 11.5–15.5)
WBC: 7.7 10*3/uL (ref 4.0–10.5)

## 2018-02-21 NOTE — BH Assessment (Addendum)
Assessment Note  Yesenia Washington is an 77 y.o. female who presents to the ED voluntarily. Pt states she was BIB [employer] after making a suicidal post on social media. Pt is irritable throughout the assessment and states she wants to be d/c from the ED. Pt admits she posted on Facebook that she wanted to "blow her brains out" but denies any intent or access. Pt states she was feeling depressed and states she did not mean she actually wanted to kill herself. Pt states a former student saw the post and called [employer]. Pt is crying throughout the assessment and states she was "dragged out of her house and put in a police car."  Pt stated "yes I said I wanted to blow my brains out but so what, everyone says that." Pt denies HI and denies AVH at present.   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 Pt reports her mother died in a horrible way, laying in her own feces and vomit for 4 days before she was found. Pt states she feels that her mother was angry at her when she passed away and this also causes her distress. Pt also states she had a "botched foot surgery" which sometimes makes it difficult for her to walk without pain.   Pt states all of these stressors are causing her to feel overwhelmed. Pt states she has "too much going on right now and wouldn't really kill herself." TTS spoke with the EDP who reports the pt admitted to her she had thoughts of killing herself "in 4-6 months after the book tour is over."  Patriciaann Clan, [place] recommends inpt gero-psych treatment. EDP Blanchie Dessert, MD and [facility], Junior Jones Bales, RN have been advised of the disposition. EDP Blanchie Dessert, MD requests pt be reassessed in the AM by  psych due to low risk factors for suicide. Pt is currently VOL and EDP states she does not feel the need to IVC the pt at this time.  Diagnosis: MDD, recurrent, severe; GAD, severe  Alcohol use disorder, severe   Past Medical History:  Past Medical History:  Diagnosis Date  . Alcohol abuse   . Allergy   . Arthritis   . Hx of melanoma of skin   . Hypothyroid     Past Surgical History:  Procedure Laterality Date  . ABDOMINAL HYSTERECTOMY      Family History:  Family History  Problem Relation Age of Onset  . Atrial fibrillation Mother   . Arthritis Mother   . Cancer Father   . Atrial fibrillation Maternal Aunt   . Heart disease Paternal Aunt   . Heart disease Paternal Grandmother     Social History:  reports that she has quit smoking. She has never used smokeless tobacco. Her alcohol and drug histories are not on file.  Additional Social History:  Alcohol / Drug Use Pain Medications: See MAR Prescriptions: See MAR Over the Counter: See MAR History of alcohol / drug use?: Yes Substance #1 Name of Substance 1: Alcohol 1 - Age of First Use: unknown 1 - Amount (size/oz): 1/2 bottle of wine, 2 shots of bourbon  1 - Frequency: daily 1 - Duration: ongoing 1 - Last Use / Amount:  02/21/18  CIWA: CIWA-Ar BP: (!) 133/91 Pulse Rate: 98 COWS:    Allergies:  Allergies  Allergen Reactions  . Darvon     Hallucinations    Home Medications:  (Not in a hospital admission)  OB/GYN Status:  No LMP recorded.  General Assessment Data Location of Assessment: [facility] TTS Assessment: In system Is this a Tele or Face-to-Face Assessment?: Face-to-Face Is this an Initial Assessment or a Re-assessment for this encounter?: Initial Assessment Marital status: Married Is patient pregnant?: No Pregnancy Status: No Living Arrangements: Spouse/significant other Can pt return to current living arrangement?: Yes Admission Status: Voluntary Is patient capable of signing voluntary  admission?: Yes Referral Source: Self/Family/Friend Insurance type: [facility]     Crisis Care Plan Living Arrangements: Spouse/significant other Name of Psychiatrist: none Name of Therapist: none  Education Status Is patient currently in school?: No Is the patient employed, unemployed or receiving disability?: Employed  Risk to self with the past 6 months Suicidal Ideation: Yes-Currently Present Has patient been a risk to self within the past 6 months prior to admission? : No Suicidal Intent: No Has patient had any suicidal intent within the past 6 months prior to admission? : No Is patient at risk for suicide?: Yes Suicidal Plan?: No Has patient had any suicidal plan within the past 6 months prior to admission? : No Access to Means: No What has been your use of drugs/alcohol within the last 12 months?: reports to daily alcohol use  Previous Attempts/Gestures: No Triggers for Past Attempts: None known Intentional Self Injurious Behavior: None Family Suicide History: Yes(father killed himself 25 years ago ) Recent stressful life event(s): Loss (Comment), Recent negative physical changes, Turmoil (Comment), Trauma (Comment)(work stress, mom died, dog died, house stress) Persecutory voices/beliefs?: No Depression: Yes Depression Symptoms: Despondent, Insomnia, Tearfulness, Isolating, Fatigue, Guilt, Feeling worthless/self pity, Feeling angry/irritable, Loss of interest in usual pleasures Substance abuse history and/or treatment for substance abuse?: Yes Suicide prevention information given to non-admitted patients: Not applicable  Risk to Others within the past 6 months Homicidal Ideation: No Does patient have any lifetime risk of violence toward others beyond the six months prior to admission? : No Thoughts of Harm to Others: No Current Homicidal Intent: No Current Homicidal Plan: No Access to Homicidal Means: No History of harm to others?: No Assessment of Violence: None  Noted Does patient have access to weapons?: No Criminal Charges Pending?: No Does patient have a court date: No Is patient on probation?: No  Psychosis Hallucinations: None noted Delusions: None noted  Mental Status Report Appearance/Hygiene: In scrubs, Unremarkable Eye Contact: Good Motor Activity: Freedom of movement Speech: Logical/coherent Level of Consciousness: Alert, Irritable Mood: Depressed, Anxious, Sad, Helpless Affect: Anxious, Depressed Anxiety Level: Severe Thought Processes: Relevant, Coherent Judgement: Partial Orientation: Person, Place, Time, Situation, Appropriate for developmental age Obsessive Compulsive Thoughts/Behaviors: None  Cognitive Functioning Concentration: Normal Memory: Remote Intact, Recent Intact Is patient IDD: No Is patient DD?: No Insight: Fair Impulse Control: Fair Appetite: Good Have you had any weight changes? : No Change Sleep: Decreased Total Hours of Sleep: 5 Vegetative Symptoms: Staying in bed  ADLScreening [facility]) Patient's cognitive ability adequate to safely complete daily activities?: Yes Patient able to express need for assistance with ADLs?: Yes Independently performs ADLs?: Yes (appropriate for developmental age)  Prior Inpatient Therapy Prior Inpatient Therapy: No  Prior Outpatient Therapy Prior Outpatient Therapy: Yes Prior Therapy Dates: unknown Prior Therapy Facilty/Provider(s): unknown Reason for Treatment: Depression Does patient have an ACCT  team?: No Does patient have Intensive In-House Services?  : No Does patient have [facility] services? : No Does patient have P4CC services?: No  ADL Screening (condition at time of admission) Patient's cognitive ability adequate to safely complete daily activities?: Yes Is the patient deaf or have difficulty hearing?: No Does the patient have difficulty seeing, even when wearing glasses/contacts?: No Does the patient have difficulty concentrating,  remembering, or making decisions?: No Patient able to express need for assistance with ADLs?: Yes Does the patient have difficulty dressing or bathing?: No Independently performs ADLs?: Yes (appropriate for developmental age) Does the patient have difficulty walking or climbing stairs?: Yes(pain in feet) Weakness of Legs: None Weakness of Arms/Hands: None  Home Assistive Devices/Equipment Home Assistive Devices/Equipment: Eyeglasses    Abuse/Neglect Assessment (Assessment to be complete while patient is alone) Abuse/Neglect Assessment Can Be Completed: Yes Physical Abuse: Denies Verbal Abuse: Yes, past (Comment)(childhood) Sexual Abuse: Denies Exploitation of patient/patient's resources: Denies Self-Neglect: Denies     Regulatory affairs officer (For Healthcare) Does Patient Have a Medical Advance Directive?: Yes Type of Advance Directive: Living will    Additional Information 1:1 In Past 12 Months?: No CIRT Risk: No Elopement Risk: No Does patient have medical clearance?: Yes     Disposition: Patriciaann Clan, [place] recommends inpt gero-psych treatment. EDP Blanchie Dessert, MD and [place], Junior Jones Bales, RN have been advised of the disposition. EDP Blanchie Dessert, MD requests pt be reassessed in the AM by psych due to low risk factors for suicide. Pt is currently VOL and EDP states she does not feel the need to IVC the pt at this time. Disposition Initial Assessment Completed for this Encounter: Yes Disposition of Patient: (overnight observation per EDP ) Patient refused recommended treatment: No  On Site Evaluation by:   Reviewed with Physician:    Lyanne Co 02/21/2018 11:59 PM

## 2018-02-21 NOTE — ED Provider Notes (Addendum)
[place] DEPT Provider Note   CSN: [id number] Arrival date & time: 02/21/18  2153     History   Chief Complaint Chief Complaint  Patient presents with  . Suicidal    HPI Yesenia Washington is a 77 y.o. female.  Patient is a 77 year old female with a history of depression, hypothyroid disease and prior alcohol abuse presenting today under IVC commitment for suicidal gestures on Facebook.  Patient is extremely upset currently because she was brought here against her will by police.  She states she has been depressed for a long time because she has had multiple bad things happen to her.  She has had increased stress over the last few days but when asked if she is suicidal she states she would not hurt herself for some time because she is a responsible person and has a lot of things going on in the next 4-5 months.  Patient states she did put on Facebook that she felt like blowing out her brains but she said she would not act upon that at this time.  She says she is not having any hallucinations.  She is sad but has not tried to hurt herself.  Patient is under the care of her regular doctor who knows that she is depressed.  IVC paperwork was taken out by a former student who saw her Facebook posts and was concerned for her well-being.  The history is provided by the patient.    Past Medical History:  Diagnosis Date  . Alcohol abuse   . Allergy   . Arthritis   . Hx of melanoma of skin   . Hypothyroid     Patient Active Problem List   Diagnosis Date Noted  . Knee fracture, left 06/21/2009    Past Surgical History:  Procedure Laterality Date  . ABDOMINAL HYSTERECTOMY       OB History   None      Home Medications    Prior to Admission medications   Medication Sig Start Date End Date Taking? Authorizing Provider  BIOTIN 5000 PO Take 1 tablet by mouth daily.    [provider]  CALCIUM CITRATE PO Take 945 mg by mouth daily.     [provider]  cetirizine (ZYRTEC) 10 MG tablet Take 10 mg by mouth daily.    [provider]  cholecalciferol (VITAMIN D) 1000 units tablet Take 1,000 Units by mouth daily.    [provider]  clonazePAM (KLONOPIN) 2 MG tablet Take 2 mg by mouth daily.    [provider]  Coenzyme Q10 (COQ10) 200 MG CAPS Take 1 tablet by mouth daily.    [provider]  folic acid (FOLVITE) 409 MCG tablet Take 400 mcg by mouth daily.    [provider]  levothyroxine (SYNTHROID, LEVOTHROID) 125 MCG tablet Take 125 mcg by mouth daily.      [provider]  losartan (COZAAR) 50 MG tablet Take 50 mg by mouth daily.    [provider]  Misc Natural Products (TART CHERRY ADVANCED PO) Take 1,200 mg by mouth daily.    [provider]  omeprazole (PRILOSEC) 20 MG capsule Take 20 mg by mouth as needed.    [provider]  Probiotic Product (PROBIOTIC PO) Take by mouth daily. Pt takes 47 Age 57+    [provider]  rosuvastatin (CRESTOR) 5 MG tablet Take 5 mg by mouth daily.    [provider]  TURMERIC PO Take  1,000 mg by mouth daily.    [provider]  UNABLE TO FIND Estradiol vaginal inserts - 10 mcg; twice a week    [provider]  UNABLE TO FIND daily. Olive Leaf [phone] mg    [provider]  vitamin B-12 (CYANOCOBALAMIN) 1000 MCG tablet Take 1,000 mcg by mouth daily.    [provider]    Family History Family History  Problem Relation Age of Onset  . Atrial fibrillation Mother   . Arthritis Mother   . Cancer Father   . Atrial fibrillation Maternal Aunt   . Heart disease Paternal Aunt   . Heart disease Paternal Grandmother     Social History Social History   Tobacco Use  . Smoking status: Former Research scientist (life sciences)  . Smokeless tobacco: Never Used  Substance Use Topics  . Alcohol use: Not on file  . Drug use: Not on file     Allergies   Darvon   Review  of Systems Review of Systems  All other systems reviewed and are negative.    Physical Exam Updated Vital Signs BP (!) 133/91 (BP Location: Right Arm)   Pulse 98   Temp 97.7 F (36.5 C) (Oral)   Resp 16   Ht 5\' 6"  (1.676 m)   Wt 68 kg (150 lb)   BMI 24.21 kg/m   Physical Exam  Constitutional: She is oriented to person, place, and time. She appears well-developed and well-nourished. No distress.  Patient is upset and angry that she is here  HENT:  Head: Normocephalic and atraumatic.  Mouth/Throat: Oropharynx is clear and moist.  Eyes: Pupils are equal, round, and reactive to light. Conjunctivae and EOM are normal.  Neck: Normal range of motion. Neck supple.  Cardiovascular: Normal rate, regular rhythm and intact distal pulses.  No murmur heard. Pulmonary/Chest: Effort normal and breath sounds normal. No respiratory distress. She has no wheezes. She has no rales.  Abdominal: Soft. She exhibits no distension. There is no tenderness. There is no rebound and no guarding.  Musculoskeletal: Normal range of motion. She exhibits no edema or tenderness.  Neurological: She is alert and oriented to person, place, and time.  Skin: Skin is warm and dry. No rash noted. No erythema.  Psychiatric: Her behavior is normal. She is not actively hallucinating. Thought content is not paranoid and not delusional. She exhibits a depressed mood. She expresses no homicidal ideation. She expresses no suicidal plans.  She does contemplate suicide but states she would not act on it right now because she has multiple obligations and she is a responsible person  Nursing note and vitals reviewed.    ED Treatments / Results  Labs (all labs ordered are listed, but only abnormal results are displayed) Labs Reviewed  CBC WITH DIFFERENTIAL/PLATELET  BASIC METABOLIC PANEL  ETHANOL  RAPID URINE DRUG SCREEN, HOSP PERFORMED    EKG None  Radiology No results found.  Procedures Procedures (including  critical care time)  Medications Ordered in ED Medications - No data to display   Initial Impression / Assessment and Plan / ED Course  I have reviewed the triage vital signs and the nursing notes.  Pertinent labs & imaging results that were available during my care of the patient were reviewed by me and considered in my medical decision making (see chart for details).     Patient is here under IVC commitment after a former student took paperwork out on her because they were concerned for her well-being because of a  post on Facebook about wanting to blow out her brains.  Patient does claim to be depressed and does not deny having thoughts of suicide but states she would never act on it at this time because she has too much going on in her life.  She is not hallucinating vital signs are reassuring and she medically appears stable.  Will have TTS evaluate  11:45 PM On further review patient is not IVC'd and when TTS evaluated nurse practitioner recommended and patient however did not feel that patient will want to do that and at this time does not meet criteria for involuntary commitment.  Patient is willing to stay until the morning for evaluation by a psychiatrist.  She does have some risks factors including being a heavy drinker with a long-standing history of depression.  However she does not own weapons and does not have a plan.  Final Clinical Impressions(s) / ED Diagnoses   Final diagnoses:  Suicidal thoughts  Depression, unspecified depression type    ED Discharge Orders    None       Blanchie Dessert, MD 02/21/18 2255    Blanchie Dessert, MD 02/21/18 [phone]

## 2018-02-21 NOTE — Progress Notes (Signed)
Patriciaann Clan, [place] recommends inpt gero-psych treatment. EDP Blanchie Dessert, MD and [place], Junior Jones Bales, RN have been advised of the disposition. EDP Blanchie Dessert, MD requests pt be reassessed in the AM by psych due to low risk factors for suicide. Pt is currently VOL and EDP states she does not feel the need to IVC the pt at this time.  Lind Covert, MSW, LCSW Therapeutic Triage Specialist  [phone]

## 2018-02-21 NOTE — ED Notes (Signed)
Pt watch, ring and cellphone on pt belonging bag.

## 2018-02-21 NOTE — ED Triage Notes (Signed)
Brought in by [employer] from home , Per [employer] a former student of patient called them about patient posting suicide notes on her Facebook page. Per [employer] when they arrived in pt house patient stated she have thoughts on killing herself. Patient denies to hurt herself upon arrival.

## 2018-02-22 ENCOUNTER — Emergency Department (HOSPITAL_COMMUNITY): Payer: Medicare Other

## 2018-02-22 ENCOUNTER — Other Ambulatory Visit: Payer: Self-pay

## 2018-02-22 DIAGNOSIS — F322 Major depressive disorder, single episode, severe without psychotic features: Secondary | ICD-10-CM | POA: Diagnosis present

## 2018-02-22 LAB — URINALYSIS, ROUTINE W REFLEX MICROSCOPIC
BILIRUBIN URINE: NEGATIVE
Glucose, UA: NEGATIVE mg/dL
HGB URINE DIPSTICK: NEGATIVE
Ketones, ur: NEGATIVE mg/dL
Leukocytes, UA: NEGATIVE
NITRITE: NEGATIVE
PROTEIN: NEGATIVE mg/dL
SPECIFIC GRAVITY, URINE: 1.004 — AB (ref 1.005–1.030)
pH: 6 (ref 5.0–8.0)

## 2018-02-22 MED ORDER — ONDANSETRON HCL 4 MG PO TABS: 4.0000 mg | ORAL_TABLET | Freq: Three times a day (TID) | ORAL | Status: DC | PRN

## 2018-02-22 MED ORDER — PANTOPRAZOLE SODIUM 40 MG PO TBEC
40.0000 mg | DELAYED_RELEASE_TABLET | Freq: Every day | ORAL | Status: DC
  Administered 2018-02-22: 40 mg via ORAL
  Filled 2018-02-22: qty 1

## 2018-02-22 MED ORDER — LOSARTAN POTASSIUM 50 MG PO TABS
50.0000 mg | ORAL_TABLET | Freq: Every day | ORAL | Status: DC
  Administered 2018-02-22: 50 mg via ORAL
  Filled 2018-02-22 (×2): qty 1

## 2018-02-22 MED ORDER — CLONAZEPAM 1 MG PO TABS: 2.0000 mg | ORAL_TABLET | Freq: Every day | ORAL | Status: DC

## 2018-02-22 MED ORDER — CLONAZEPAM 1 MG PO TABS
1.0000 mg | ORAL_TABLET | Freq: Every day | ORAL | Status: DC
  Administered 2018-02-22: 1 mg via ORAL
  Filled 2018-02-22: qty 1

## 2018-02-22 MED ORDER — BIOTIN 5 MG PO CAPS: ORAL_CAPSULE | Freq: Every day | ORAL | Status: DC

## 2018-02-22 MED ORDER — ACETAMINOPHEN 325 MG PO TABS: 650.0000 mg | ORAL_TABLET | ORAL | Status: DC | PRN

## 2018-02-22 MED ORDER — VITAMIN B-12 1000 MCG PO TABS
1000.0000 ug | ORAL_TABLET | Freq: Every day | ORAL | Status: DC
  Administered 2018-02-22: 1000 ug via ORAL
  Filled 2018-02-22 (×2): qty 1

## 2018-02-22 MED ORDER — ALUM & MAG HYDROXIDE-SIMETH 200-200-20 MG/5ML PO SUSP: 30.0000 mL | Freq: Four times a day (QID) | ORAL | Status: DC | PRN

## 2018-02-22 MED ORDER — LEVOTHYROXINE SODIUM 125 MCG PO TABS
125.0000 ug | ORAL_TABLET | Freq: Every day | ORAL | Status: DC
  Administered 2018-02-22 – 2018-02-23 (×2): 125 ug via ORAL
  Filled 2018-02-22 (×2): qty 1

## 2018-02-22 MED ORDER — LORAZEPAM 1 MG PO TABS
1.0000 mg | ORAL_TABLET | Freq: Once | ORAL | Status: AC
  Administered 2018-02-22: 1 mg via ORAL
  Filled 2018-02-22: qty 1

## 2018-02-22 MED ORDER — COQ10 200 MG PO CAPS: 1.0000 | ORAL_CAPSULE | Freq: Every day | ORAL | Status: DC

## 2018-02-22 MED ORDER — FOLIC ACID 1 MG PO TABS
500.0000 ug | ORAL_TABLET | Freq: Every day | ORAL | Status: DC
  Administered 2018-02-22: 0.5 mg via ORAL
  Filled 2018-02-22: qty 1

## 2018-02-22 MED ORDER — TURMERIC 1053 MG PO TABS: 1000.0000 mg | ORAL_TABLET | Freq: Every day | ORAL | Status: DC

## 2018-02-22 MED ORDER — CALCIUM CARBONATE 1250 (500 CA) MG PO TABS
1250.0000 mg | ORAL_TABLET | Freq: Every day | ORAL | Status: DC
  Administered 2018-02-22: 1250 mg via ORAL
  Filled 2018-02-22 (×2): qty 1

## 2018-02-22 MED ORDER — ROSUVASTATIN CALCIUM 5 MG PO TABS
5.0000 mg | ORAL_TABLET | Freq: Every day | ORAL | Status: DC
  Filled 2018-02-22: qty 1

## 2018-02-22 MED ORDER — VITAMIN D3 25 MCG (1000 UNIT) PO TABS
1000.0000 [IU] | ORAL_TABLET | Freq: Every day | ORAL | Status: DC
  Administered 2018-02-22: 1000 [IU] via ORAL
  Filled 2018-02-22 (×2): qty 1

## 2018-02-22 NOTE — BH Assessment (Signed)
[facility] Assessment Progress Note  Per Buford Dresser, DO, this pt requires psychiatric hospitalization at this time.  The following facilities have been contacted to seek placement for this pt, with results as noted:  Beds available, information sent, decision pending:  [facility]   Declined:  [facility]   At capacity:  [facility]   Jalene Mullet, [place] Coordinator [phone]

## 2018-02-22 NOTE — Progress Notes (Signed)
CSW received phone call from [place] at [facility]. [facility] accepted pt. Pt can be transported there tomorrow anytime after 9. Per [facility] pt needs to be IVC'd for transport. Pt will be going to [facility].  Accepting Doctor: Dr. Jonelle Sports Report Number: [phone].   CSW relayed information to [employer] to see if pt is agreeable.   Wendelyn Breslow, [facility]  [phone]

## 2018-02-22 NOTE — ED Notes (Addendum)
Husband-Micheal [phone]   Cell-[phone]

## 2018-02-22 NOTE — Progress Notes (Signed)
PHARMACIST - PHYSICIAN ORDER COMMUNICATION  CONCERNING: P&T Medication Policy on Herbal Medications  DESCRIPTION:  This patient's order for:  Biotin, CoQ10 and Turmeric has been noted.  This product(s) is classified as an "herbal" or natural product. Due to a lack of definitive safety studies or FDA approval, nonstandard manufacturing practices, plus the potential risk of unknown drug-drug interactions while on inpatient medications, the Pharmacy and Therapeutics Committee does not permit the use of "herbal" or natural products of this type within [facility].   ACTION TAKEN: The pharmacy department is unable to verify this order at this time and your patient has been informed of this safety policy. Please reevaluate patient's clinical condition at discharge and address if the herbal or natural product(s) should be resumed at that time.   Thanks, Dorrene German 02/22/2018 12:29 AM

## 2018-02-22 NOTE — ED Provider Notes (Signed)
Spoke with Tom, TTS, who advised that the patient has been accepted to [facility] in the morning. They requested IVC for transport. Given patient posted suicide threat on social media, I feel this is appropriate. I filed IVC papers and discussed with patient. She will be transported in the morning.   Little, Wenda Overland, MD 02/22/18 [phone]

## 2018-02-22 NOTE — BH Assessment (Signed)
[facility] Assessment Progress Note  Please see Wendelyn Breslow, LCSW's note dated 02/22/2018.  This has been staffed with EDP Theotis Burrow, MD, who agrees to initiate IVC.  Pt's nurse, Caren Griffins, has been notified of disposition.  At 15:58 this Probation officer called [facility] and spoke to [place], informing her of EDP's decision.  Pt is to be transported via [facility] when the time comes.  Jalene Mullet, [place] Coordinator [phone]

## 2018-02-22 NOTE — ED Notes (Signed)
Patient denies SI/HI/AVH at this time. Plan of care discussed. Encouragement and support provided and safety maintain.1:1 monitoring in place. Will continue to monitoring patient.

## 2018-02-23 NOTE — ED Notes (Signed)
Patient discharged in sheriff custody.

## 2018-02-23 NOTE — ED Notes (Signed)
Patient refused e signature. Pt IVC.

## 2018-02-23 NOTE — ED Notes (Signed)
Patient preparing for transport to [facility] by [employer].  Patient's husband currently at bedside.  Patient is tearful about admission.

## 2019-09-22 DIAGNOSIS — D369 Benign neoplasm, unspecified site: Secondary | ICD-10-CM

## 2019-09-22 DIAGNOSIS — K5790 Diverticulosis of intestine, part unspecified, without perforation or abscess without bleeding: Secondary | ICD-10-CM

## 2019-09-22 HISTORY — DX: Benign neoplasm, unspecified site: D36.9

## 2019-09-22 HISTORY — DX: Diverticulosis of intestine, part unspecified, without perforation or abscess without bleeding: K57.90

## 2020-01-10 ENCOUNTER — Other Ambulatory Visit: Payer: Self-pay | Admitting: Family Medicine

## 2020-01-10 DIAGNOSIS — R197 Diarrhea, unspecified: Secondary | ICD-10-CM

## 2020-01-10 DIAGNOSIS — R1084 Generalized abdominal pain: Secondary | ICD-10-CM

## 2020-01-10 DIAGNOSIS — G8929 Other chronic pain: Secondary | ICD-10-CM

## 2020-01-17 ENCOUNTER — Ambulatory Visit
Admission: RE | Admit: 2020-01-17 | Discharge: 2020-01-17 | Disposition: A | Discharge status: Home / Self Care | Payer: Medicare Other | Source: Ambulatory Visit | Attending: Family Medicine | Admitting: Family Medicine

## 2020-01-17 DIAGNOSIS — G8929 Other chronic pain: Secondary | ICD-10-CM

## 2020-01-17 DIAGNOSIS — R197 Diarrhea, unspecified: Secondary | ICD-10-CM

## 2020-01-21 ENCOUNTER — Ambulatory Visit
Admission: RE | Admit: 2020-01-21 | Discharge: 2020-01-21 | Disposition: A | Discharge status: Home / Self Care | Payer: Medicare PPO | Source: Ambulatory Visit | Attending: Family Medicine | Admitting: Family Medicine

## 2020-01-21 DIAGNOSIS — R197 Diarrhea, unspecified: Secondary | ICD-10-CM

## 2020-01-21 DIAGNOSIS — R1084 Generalized abdominal pain: Secondary | ICD-10-CM

## 2020-01-21 MED ORDER — IOPAMIDOL (ISOVUE-300) INJECTION 61%
100.0000 mL | Freq: Once | INTRAVENOUS | Status: AC | PRN
  Administered 2020-01-21: 100 mL via INTRAVENOUS

## 2020-03-11 ENCOUNTER — Ambulatory Visit
Admission: RE | Admit: 2020-03-11 | Discharge: 2020-03-11 | Disposition: A | Discharge status: Home / Self Care | Payer: Medicare PPO | Source: Ambulatory Visit | Attending: Gastroenterology | Admitting: Gastroenterology

## 2020-03-11 ENCOUNTER — Other Ambulatory Visit: Payer: Self-pay | Admitting: Gastroenterology

## 2020-03-11 DIAGNOSIS — R159 Full incontinence of feces: Secondary | ICD-10-CM

## 2020-11-21 DIAGNOSIS — K760 Fatty (change of) liver, not elsewhere classified: Secondary | ICD-10-CM

## 2020-11-21 HISTORY — DX: Fatty (change of) liver, not elsewhere classified: K76.0

## 2021-01-13 ENCOUNTER — Telehealth: Payer: Self-pay | Admitting: Gastroenterology

## 2021-01-13 NOTE — Telephone Encounter (Signed)
Hi Dr. Havery Moros, we have received a referral from patient's PCP for IBS and fecal incontinence. Patient has seen Dr. Watt Climes before but would like to transfer her GI care to you because she has not been pleased with the care that she received. GI Records have been received and they will be sent to you for review. Please advise on scheduling. Thank you.

## 2021-01-14 ENCOUNTER — Encounter: Payer: Self-pay | Admitting: Gastroenterology

## 2021-01-14 NOTE — Telephone Encounter (Signed)
I would be happy to see this patient. Records reviewed. Thanks

## 2021-01-14 NOTE — Telephone Encounter (Signed)
Consult scheduled on 02/25/21 at 9:20am.

## 2021-02-03 ENCOUNTER — Other Ambulatory Visit: Payer: Self-pay | Admitting: Family Medicine

## 2021-02-03 DIAGNOSIS — R932 Abnormal findings on diagnostic imaging of liver and biliary tract: Secondary | ICD-10-CM

## 2021-02-25 ENCOUNTER — Encounter: Payer: Self-pay | Admitting: Gastroenterology

## 2021-02-25 ENCOUNTER — Other Ambulatory Visit: Payer: Medicare PPO

## 2021-02-25 ENCOUNTER — Ambulatory Visit: Payer: Medicare PPO | Admitting: Gastroenterology

## 2021-02-25 VITALS — BP 110/80 | HR 84 | Ht 65.0 in | Wt 143.0 lb

## 2021-02-25 DIAGNOSIS — K529 Noninfective gastroenteritis and colitis, unspecified: Secondary | ICD-10-CM | POA: Diagnosis not present

## 2021-02-25 DIAGNOSIS — R159 Full incontinence of feces: Secondary | ICD-10-CM

## 2021-02-25 MED ORDER — LOPERAMIDE HCL 2 MG PO TABS: 1.0000 mg | ORAL_TABLET | Freq: Every morning | ORAL | 0 refills | Status: DC

## 2021-02-25 NOTE — Patient Instructions (Addendum)
If you are age 78 or older, your body mass index should be between 23-30. Your Body mass index is 23.8 kg/m. If this is out of the aforementioned range listed, please consider follow up with your Primary Care Provider.  Please go to the lab in the basement of our building to have lab work done as you leave today. Hit "B" for basement when you get on the elevator.  When the doors open the lab is on your left.  We will call you with the results. Thank you.  Due to recent changes in healthcare laws, you may see the results of your imaging and laboratory studies on MyChart before your provider has had a chance to review them.  We understand that in some cases there may be results that are confusing or concerning to you. Not all laboratory results come back in the same time frame and the provider may be waiting for multiple results in order to interpret others.  Please give [place] 48 hours in order for your provider to thoroughly review all the results before contacting the office for clarification of your results.   We are giving you a Low FOD-MAP diet to review today.  Resume 1/2 tablet of Imodium every morning and titrate as needed.  Discontinue magnesium.  We are referring you to Pelvic Floor Physical Therapy.  They will contact you directly to schedule an appointment.  It may take a week or more;  Please feel free to contact us if you have not heard from them within 2 weeks and we will follow up.   You have been scheduled for a follow up appointment for Thursday, 06-17-21 at 9:00am.  Please call as soon as possible to reschedule if this is not convenient. Thank you.   Thank you for entrusting me with your care and for choosing [facility], Dr. [place] Cellar

## 2021-02-25 NOTE — Progress Notes (Signed)
HPI :  80 year old female with a history of chronic diarrhea and fecal incontinence, CKD, COPD, diverticulosis, tubular adenoma, referred here by Derinda Late, MD for further evaluation of fecal incontinence and chronic diarrhea.  I spent a good deal of time obtaining history from the patient today and reviewing her medical records.  She states she has had bowel problems that have bothered her significantly for the past 10 years or so.  Back in 2012 she was placed on pravastatin and noticed some loose stools and fecal incontinence with this.  At that point time she went on a gluten-free diet and states it helped and symptoms seem to resolve for a good bit of time.  Despite maintenance of gluten-free diet her symptoms came back in 2018, recurrence of loose stools and fecal incontinence.  She took a half Imodium tab per day and states this worked very well for her for about a year and she did not have much problems.  Over the past few years however her symptoms have come back significantly.  She states in 2020 she had some ice cream that did not so well with her and ever since she has had severe bowel problems.  She states on a good day she will have 6 loose bowel movements per day, on a bad day up to 30 loose stools per day.  She has significant leakage and fecal incontinence on a daily basis, uses pads to prevent from soiling.  She states she does not really know when the leakage is happening but she has a hard time keeping things in.  She does have some urgency with her stools at times.  She has seen [facility] for this in the past.  She has had some imaging studies done for this which has showed a "constipated state".  There was concern for fecal incontinence due to overflow from constipation.  She was placed on MiraLAX which provided no benefit made her symptoms worse.  She was given a trial of Linzess, which made her stool even looser and states made her symptoms significantly worse.  She  had a colonoscopy with Dr. Watt Climes in 2020, no overt inflammation, one small adenoma.  Biopsies negative for microscopic colitis.  She states the bowel preparation did not provide any benefit for her and she continued to have diarrhea the day after the procedure.  Her primary care gave her a trial of a bowel preparation as well to clear out her colon to help reduce overflow, she states this made things much worse and did not provide any benefit.  On the other hand she was tried on Colestid and cholestyramine.  She states it made bloating worse and she just did not tolerate it, did not like the way she felt on it.  She retried lower dose Imodium perhaps it sounds like in 2020 and it did not work as well as it used to.  She has used magnesium in the past and was told to stop that but it did not really help.  She has tried using that again recently for help with insomnia.  She denies any blood in her stools.  She has an occasional aspirin but denies any routine NSAID use.  She was told she might need " gel injections" to her anus for low tone of her anal sphincter, she is quite anxious about that.  She does endorse a history of alcohol abuse but has cut back on her alcohol significantly in recent years.  She has about 2 glasses of wine per day.  No history of pancreatitis she is aware of.  No history of known cirrhosis.  She has had a prior ultrasound in February 2021 showing suspected focal fatty liver sparing.  She had a follow-up CT scan of the abdomen pelvis with contrast in March of last year which showed that her liver was normal.  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  She is here for another opinion on how to manage this.  She states she is been on a gluten-free diet again for upwards of 10 years.  She has had negative celiac antibody testing she thinks a very long time ago but unclear of  that, no records available of that.  Prior workup has included.  TSH normal ESR normal CBC normal, no anemia or leukocytosis LFTs normal  Abdominal x ray - 03/11/20 - large stool burden  CT abdomen / pelvis 01/21/20 - moderate stool burden in right and transverse colon suggestive of constipation, liver is NORMAL  Colonoscopy 09/27/19 - one diminutive adenoma, random biopsies showed no evidence of microscopic colitis, normal ileum, low sphincter tone  US abdomen 01/17/20: IMPRESSION: Large nonspecific area of increased hepatic parenchymal echogenicity within the right lobe of the liver which may represent focal fatty deposition. Given the overall appearance however confirmation with pre and post contrast-enhanced MRI is recommended.  Hepatic steatosis.   Past Medical History:  Diagnosis Date  . Alcohol abuse   . Allergy   . Anxiety 2015  . Arthritis   . Carpal tunnel syndrome, bilateral 2014  . CKD (chronic kidney disease) stage 3, GFR 30-59 ml/min (HCC)   . COPD (chronic obstructive pulmonary disease) ([place]) 2015  . Depression 2016   hospitalized in 2019 for 1 week  . Diverticulosis 09/2019  . GERD (gastroesophageal reflux disease) 2007  . Hx of melanoma of skin   . Hypertension   . Hypothyroid   . IBS (irritable bowel syndrome)   . Malignant melanoma ([place])    1986 and 1987  . Mixed hyperlipidemia   . Osteopenia 2000  . Peripheral neuropathy 2015   hands and feet  . [place] spotted fever 1999  . Scalp alopecia   . Tubular adenoma 09/2019  . Venous insufficiency      Past Surgical History:  Procedure Laterality Date  . ABDOMINAL HYSTERECTOMY  1994  . BLEPHAROPLASTY Bilateral 2009  . BUNIONECTOMY Right 2018  . CESAREAN SECTION  1983  . COLONOSCOPY W/ POLYPECTOMY  09/2019   tubular adenoma  . TONSILLECTOMY AND ADENOIDECTOMY  1951   Family History  Problem Relation Age of Onset  . Atrial fibrillation Mother   . Arthritis Mother   . Dementia Mother    . Prostate cancer Father 86  . CAD Father 2  . Atrial fibrillation Maternal Aunt   . Heart disease Paternal Aunt   . Heart disease Paternal Grandmother   . Colon polyps Brother   . Colon cancer Neg Hx   . Pancreatic cancer Neg Hx   . Esophageal cancer Neg Hx    Social History   Tobacco Use  . Smoking status: Former Research scientist (life sciences)  . Smokeless tobacco: Never Used  Substance Use Topics  . Alcohol use: Yes   Current Outpatient Medications  Medication Sig Dispense Refill  . ALPRAZolam (XANAX) 1 MG tablet Take 1 mg by mouth at  bedtime.    Marland Kitchen BIOTIN 5000 PO Take 1 tablet by mouth daily.    . cetirizine (ZYRTEC) 10 MG tablet Take 10 mg by mouth daily.    . cholecalciferol (VITAMIN D) 1000 units tablet Take 1,000 Units by mouth daily.    . Coenzyme Q10 (COQ10) 200 MG CAPS Take 1 tablet by mouth daily.    Marland Kitchen levothyroxine (SYNTHROID, LEVOTHROID) 125 MCG tablet Take 125 mcg by mouth daily.    Marland Kitchen losartan (COZAAR) 50 MG tablet Take 50 mg by mouth daily.    . rosuvastatin (CRESTOR) 5 MG tablet Take 5 mg by mouth daily.    . vitamin B-12 (CYANOCOBALAMIN) 1000 MCG tablet Take 1,000 mcg by mouth daily.     No current facility-administered medications for this visit.   Allergies  Allergen Reactions  . Darvon     Hallucinations  . Oxycodone Hcl     Sedation   . Pravastatin Other (See Comments)    myalgias  . Simvastatin Other (See Comments)    Change in bowel habits     Review of Systems: All systems reviewed and negative except where noted in HPI.   Labs per HPI  Physical Exam: BP 110/80   Pulse 84   Ht 5' 5" (1.651 m)   Wt 143 lb (64.9 kg)   SpO2 97%   BMI 23.80 kg/m  Constitutional: Pleasant,well-developed, female in no acute distress. HEENT: Normocephalic and atraumatic. Conjunctivae are normal. No scleral icterus. Neck supple.  Cardiovascular: Normal rate, regular rhythm.  Pulmonary/chest: Effort normal and breath sounds normal.  Abdominal: Soft, nondistended, nontender.   There are no masses palpable.  DRE - [place] as standby - low resting pressure and squeeze, normal decent Extremities: no edema Lymphadenopathy: No cervical adenopathy noted. Neurological: Alert and oriented to person place and time. Skin: Skin is warm and dry. No rashes noted. Psychiatric: Normal mood and affect. Behavior is normal.   ASSESSMENT AND PLAN: 80 y/o female here for new patient consultation regarding the following:  Fecal incontinence Chronic diarrhea  Extensive history as outlined above.  Patient has significant fecal incontinence with multiple loose stools per day.  Her imaging was concerning for increased stool burden in the past however trial of laxatives have made things much worse for her, she has had 2 bowel preparations which made things worse and provided no benefit, I do not think overflow incontinence is causing her problem.  She previously did much better on Imodium but has not been on that in a while.  Her colonoscopy shows no evidence of microscopic colitis.  She has been on a gluten-free diet, with some improvement for that in the past but not recently.  Discussed differential diagnosis with her.  She has low anal sphincter tone on DRE and I do think she would benefit from pelvic floor physical therapy.  I will refer her to pelvic floor PT for this to help with the incontinence.  Would not recommend any invasive intervention for fecal incontinence without failing trial of pelvic floor PT first.  She is agreeable to this after discussion of it.  Otherwise discussed her loose stools.  I will screen her for celiac antibodies in case she does have active celiac disease, to see if positive and assess for refractory celiac.  The question also remains if she has celiac at all, unclear if her prior antibiotic testing was done on a gluten-free diet.  Discussed genetic testing for celiac disease, if this is negative it will  be very helpful, she is agreeable to that as well.  We  will check pancreatic fecal elastase as well to ensure okay.  I discussed other dietary options in regards to management of this, provided handout on a low FODMAP diet and counseling on this to see if that will provide any benefit.  I do think she should hold her magnesium supplement for now due to loose stools.  Recommend she resume Imodium as that has helped her in the past, we discussed dosing.  She is anxious about starting at too high of a dose.  Will start at half tab every morning and can titrate up if needed.  May consider trial of rifaximin or Viberzi pending her course.  I would like to see her in 3 to 4 months for reassessment, she can call sooner in the interim with any questions.  Otherwise I reviewed her liver imaging, she had a follow-up CT scan after her liver ultrasound which was normal, her LFTs are normal, that is reassuring.  She does have hepatic steatosis likely from chronic alcohol use and should minimize alcohol use if she can.  Plan: - refer to pelvic floor PT for fecal incontinence - trial of low FODMAP diet, handout given - resume immodium 1/2 tab q AM and titrate up as needed - stop magnesium supplement - TTG IgA and Iga level, and celiac genetic testing - pancreatic fecal elastase - f/u 3-4 months or call sooner with questions  I spent 70 minutes of time, including in depth chart review, face-to-face time with the patient, coordinating care, ordering studies and medications as appropriate, and documentation.     [place] Cellar, MD [facility]  CC: Derinda Late, MD

## 2021-02-26 ENCOUNTER — Other Ambulatory Visit: Payer: Self-pay

## 2021-02-26 DIAGNOSIS — K529 Noninfective gastroenteritis and colitis, unspecified: Secondary | ICD-10-CM

## 2021-02-26 DIAGNOSIS — R159 Full incontinence of feces: Secondary | ICD-10-CM

## 2021-02-26 LAB — IGA: Immunoglobulin A: 129 mg/dL (ref 70–320)

## 2021-02-26 LAB — TISSUE TRANSGLUTAMINASE, IGA: (tTG) Ab, IgA: <1 U/mL

## 2021-02-26 NOTE — Progress Notes (Unsigned)
Called [facility] and spoke to Juliaetta.  She will try to cancel the fecal calprotectin and the TPMT that were ordered in error. She faxed me a Authorization Form which was  received, signed and faxed back to have labs cancelled.  Jamas Lav :[phone]. Fax: [phone].  New labs ordered: Pancreatic fecal elastase and HLA DQ2/DQ8 (Celiac HLA Rflx to Abs ).

## 2021-02-27 LAB — CALPROTECTIN, FECAL

## 2021-02-27 LAB — SPECIMEN STATUS REPORT

## 2021-02-28 ENCOUNTER — Ambulatory Visit
Admission: RE | Admit: 2021-02-28 | Discharge: 2021-02-28 | Disposition: A | Discharge status: Home / Self Care | Payer: Medicare PPO | Source: Ambulatory Visit | Attending: Family Medicine | Admitting: Family Medicine

## 2021-02-28 ENCOUNTER — Other Ambulatory Visit: Payer: Self-pay

## 2021-02-28 DIAGNOSIS — R932 Abnormal findings on diagnostic imaging of liver and biliary tract: Secondary | ICD-10-CM

## 2021-02-28 MED ORDER — GADOBENATE DIMEGLUMINE 529 MG/ML IV SOLN
13.0000 mL | Freq: Once | INTRAVENOUS | Status: AC | PRN
  Administered 2021-02-28: 13 mL via INTRAVENOUS

## 2021-03-01 ENCOUNTER — Other Ambulatory Visit: Payer: Self-pay | Admitting: Family Medicine

## 2021-03-01 ENCOUNTER — Other Ambulatory Visit: Payer: Medicare PPO

## 2021-03-01 DIAGNOSIS — K529 Noninfective gastroenteritis and colitis, unspecified: Secondary | ICD-10-CM

## 2021-03-01 DIAGNOSIS — R159 Full incontinence of feces: Secondary | ICD-10-CM

## 2021-03-01 DIAGNOSIS — R9389 Abnormal findings on diagnostic imaging of other specified body structures: Secondary | ICD-10-CM

## 2021-03-01 LAB — TPMT GENETIC TEST

## 2021-03-09 LAB — PANCREATIC ELASTASE, FECAL: Pancreatic Elastase-1, Stool: 112 mcg/g — ABNORMAL LOW

## 2021-03-11 LAB — CELIAC AB TTG DGP TIGA: Antigliadin Abs, IgA: 5 units (ref 0–19)

## 2021-03-15 ENCOUNTER — Other Ambulatory Visit: Payer: Self-pay | Admitting: Gastroenterology

## 2021-03-15 ENCOUNTER — Other Ambulatory Visit: Payer: Self-pay

## 2021-03-15 MED ORDER — ZENPEP 40000-126000 UNITS PO CPEP: ORAL_CAPSULE | ORAL | 3 refills | Status: DC

## 2021-03-16 LAB — CELIAC HLA RFLX TO ABS
DQ2 (DQA1 0501/0505,DQB1 02XX): POSITIVE
DQ8 (DQA1 03XX, DQB1 0302): NEGATIVE

## 2021-03-16 LAB — CELIAC AB TTG DGP TIGA
Gliadin IgG: 2 units (ref 0–19)
IgA/Immunoglobulin A, Serum: 143 mg/dL (ref 64–422)
Tissue Transglut Ab: <2 U/mL (ref 0–5)
Transglutaminase IgA: <2 U/mL (ref 0–3)

## 2021-04-26 ENCOUNTER — Encounter: Payer: Self-pay | Admitting: Physical Therapy

## 2021-04-26 ENCOUNTER — Ambulatory Visit: Payer: Medicare PPO | Attending: Gastroenterology | Admitting: Physical Therapy

## 2021-04-26 ENCOUNTER — Other Ambulatory Visit: Payer: Self-pay

## 2021-04-26 DIAGNOSIS — M6281 Muscle weakness (generalized): Secondary | ICD-10-CM | POA: Insufficient documentation

## 2021-04-26 DIAGNOSIS — R279 Unspecified lack of coordination: Secondary | ICD-10-CM | POA: Insufficient documentation

## 2021-04-26 DIAGNOSIS — R293 Abnormal posture: Secondary | ICD-10-CM | POA: Diagnosis present

## 2021-04-26 NOTE — Therapy (Signed)
[facility] [address], [place], [place], [address] Phone: [phone]   Fax:  [phone]  Physical Therapy Evaluation  Patient Details  Name: SHALAYNA ORNSTEIN MRN: [id number] Date of Birth: [birth year] Referring Provider (PT): Armbruster, Carlota Raspberry, MD   Encounter Date: 04/26/2021   PT End of Session - 04/26/21 1121    Visit Number 1    Date for PT Re-Evaluation 07/19/21    Authorization Type [employer]    PT Start Time 1102    PT Stop Time 1142    PT Time Calculation (min) 40 min    Activity Tolerance Patient tolerated treatment well    Behavior During Therapy [facility] for tasks assessed/performed           Past Medical History:  Diagnosis Date  . Alcohol abuse   . Allergy   . Anxiety 2015  . Arthritis   . Carpal tunnel syndrome, bilateral 2014  . CKD (chronic kidney disease) stage 3, GFR 30-59 ml/min (HCC)   . COPD (chronic obstructive pulmonary disease) ([place]) 2015  . Depression 2016   hospitalized in 2019 for 1 week  . Diverticulosis 09/2019  . GERD (gastroesophageal reflux disease) 2007  . Hx of melanoma of skin   . Hypertension   . Hypothyroid   . IBS (irritable bowel syndrome)   . Malignant melanoma ([place])    1986 and 1987  . Mixed hyperlipidemia   . Osteopenia 2000  . Peripheral neuropathy 2015   hands and feet  . Rocky Mountain spotted fever 1999  . Scalp alopecia   . Tubular adenoma 09/2019  . Venous insufficiency     Past Surgical History:  Procedure Laterality Date  . ABDOMINAL HYSTERECTOMY  1994  . BLEPHAROPLASTY Bilateral 2009  . BUNIONECTOMY Right 2018  . CESAREAN SECTION  1983  . COLONOSCOPY W/ POLYPECTOMY  09/2019   tubular adenoma  . TONSILLECTOMY AND ADENOIDECTOMY  1951    There were no vitals filed for this visit.    Subjective Assessment - 04/26/21 1105    Subjective Pt states she has been doing some things that have helped (low Fodmap, zenpep).  Pt states she was also  assessed and told she has weakness of anal sphincter muscles.  pt states stools are currently pasty or pudding consistency of stools. This started 03/15/2019 after eating ice cream and the problem had never gone away. Pt had a few instances of incontinence but got better with gluten free, then happened again and immodium.    Patient Stated Goals stop haivng leakge              [facility] PT Assessment - 04/26/21 0001      Assessment   Medical Diagnosis R15.9 (ICD-10-CM) - Incontinence of feces, unspecified fecal incontinence type;K52.9 (ICD-10-CM) - Chronic diarrhea    Referring Provider (PT) Armbruster, Carlota Raspberry, MD    Prior Therapy No      Precautions   Precautions None      Restrictions   Weight Bearing Restrictions No      Balance Screen   Has the patient fallen in the past 6 months No   my Rt knee gives out sometimes   Has the patient had a decrease in activity level because of a fear of falling?  No    Is the patient reluctant to leave their home because of a fear of falling?  No      Home Ecologist residence  Prior Function   Level of Independence Independent    Vocation Retired    Leisure Higher education careers adviser   Overall Cognitive Status Within Functional Limits for tasks assessed      Posture/Postural Control   Posture/Postural Control Postural limitations    Postural Limitations Rounded Shoulders;Forward head;Increased thoracic kyphosis;Weight shift left   Lt trunk lean   Posture Comments fwd flex lumbar normal - limited by h/s      ROM / Strength   AROM / PROM / Strength PROM;Strength      PROM   Overall PROM Comments hip ER 50% bil      Strength   Overall Strength Comments Rt hip abduction 3/5; Lt 4/5      Flexibility   Soft Tissue Assessment /Muscle Length yes    Hamstrings Rt 50%; Lt 65%      Transfers   Comments leans on Lt LE due to Rt knee pain/weakness      Ambulation/Gait   Gait Pattern Within Functional  Limits                      Objective measurements completed on examination: See above findings.     Pelvic Floor Special Questions - 04/26/21 0001    Are you Pregnant or attempting pregnancy? Yes    Prior Pregnancies Yes    Number of C-Sections 1    Urinary Leakage No    Fecal incontinence Yes    Fluid intake drink water 16-24 oz, wine    Falling out feeling (prolapse) No    Skin Integrity Intact    Perineal Body/Introitus  Descended    Prolapse None    Pelvic Floor Internal Exam pt identity confirmed and internal assessment complete    Exam Type Rectal    Palpation hard time relaxing    Strength fair squeeze, definite lift    Strength # of reps --   2 quick then no relaxing   Strength # of seconds 2    Tone high but weak            [facility] Adult PT Treatment/Exercise - 04/26/21 0001      Self-Care   Self-Care Other Self-Care Comments    Other Self-Care Comments  reviewed h/s stretch                    PT Short Term Goals - 04/26/21 1436      PT SHORT TERM GOAL #1   Title ind with initial HEP    Time 4    Period Weeks    Status New    Target Date 05/24/21             PT Long Term Goals - 04/26/21 1112      PT LONG TERM GOAL #1   Title Pt will report having 1-2 BM at most per day    Baseline 3-5    Time 12    Period Weeks    Status New    Target Date 07/19/21      PT LONG TERM GOAL #2   Title Pt will report having normal BM at least 50% of the time    Baseline not having normal feeling stool    Time 12    Period Weeks    Status New    Target Date 07/19/21      PT LONG TERM GOAL #3   Title Pt reports she is able to  take a walk or sleep through the night without leakage    Time 12    Period Weeks    Status New    Target Date 07/19/21      PT LONG TERM GOAL #4   Title Pt will be ind with advanced HEP    Time 12    Period Weeks    Status New    Target Date 07/19/21      PT LONG TERM GOAL #5   Title Pt will report she  needs only one panty line at most    Baseline medium pad 2-3    Time 12    Status New    Target Date 07/19/21                  Plan - 04/26/21 1305    Clinical Impression Statement Pt presents to skilled PT to address fecal incontinence and also presents with Rt knee pain.  Pt has posture deficits as mentioned.  Pt has Le waekness bilt.  Her hamstrings and hip ER lack mobility. pt has pelvic floor weakness and difficulty relaxing after contracting.  Pt has low endurance of kegel and can hold for 3 sec.  pt will benefit from skilled PT to address impairments and return to functional activities.    Personal Factors and Comorbidities Comorbidity 2;Time since onset of injury/illness/exacerbation    Comorbidities hysterectomy, csection '83    Examination-Activity Limitations Continence;Toileting    Examination-Participation Restrictions Community Activity    Stability/Clinical Decision Making Evolving/Moderate complexity    Clinical Decision Making Moderate    Rehab Potential Excellent    PT Frequency 1x / week    PT Duration 12 weeks    PT Treatment/Interventions ADLs/Self Care Home Management;Biofeedback;Cryotherapy;Electrical Stimulation;Moist Heat;Neuromuscular re-education;Therapeutic exercise;Therapeutic activities;Manual techniques;Patient/family education;Taping;Dry needling;Passive range of motion    PT Next Visit Plan stretch and breath/bulge pelvic floor; ex's on ball, low back and fig 4 stretch, h/s stretch    PT Home Exercise Plan h/s stretch    Consulted and Agree with Plan of Care Patient           Patient will benefit from skilled therapeutic intervention in order to improve the following deficits and impairments:  Postural dysfunction,Impaired flexibility,Decreased strength,Decreased endurance,Decreased range of motion,Decreased coordination  Visit Diagnosis: Muscle weakness (generalized)  Unspecified lack of coordination  Abnormal posture     Problem  List Patient Active Problem List   Diagnosis Date Noted  . Major depressive disorder, single episode, severe without psychosis ([place]) 02/22/2018  . Knee fracture, left 06/21/2009    Jule Ser, PT 04/26/2021, 2:44 PM  [facility] [address] [place], [place], [address] Phone: [phone]   Fax:  [phone]  Name: MARQUETTA WEISKOPF MRN: [id number] Date of Birth: [birth year]

## 2021-05-11 ENCOUNTER — Other Ambulatory Visit: Payer: Self-pay

## 2021-05-11 ENCOUNTER — Encounter: Payer: Self-pay | Admitting: Physical Therapy

## 2021-05-11 ENCOUNTER — Ambulatory Visit: Payer: Medicare PPO | Admitting: Physical Therapy

## 2021-05-11 DIAGNOSIS — M6281 Muscle weakness (generalized): Secondary | ICD-10-CM | POA: Diagnosis not present

## 2021-05-11 DIAGNOSIS — R293 Abnormal posture: Secondary | ICD-10-CM

## 2021-05-11 DIAGNOSIS — R279 Unspecified lack of coordination: Secondary | ICD-10-CM

## 2021-05-11 NOTE — Patient Instructions (Signed)
Access Code: [email or web address] URL: [email or web address] Date: 05/11/2021 Prepared by: Jari Favre  Exercises Supine Hamstring Stretch with Strap - 1 x daily - 7 x weekly - 1 sets - 3 reps - 30 sec hold Supine Lower Trunk Rotation - 1 x daily - 7 x weekly - 1 sets - 10 reps - 5 sec hold Supine Piriformis Stretch - 1 x daily - 7 x weekly - 1 sets - 3 reps - 30 sec hold Supine Pelvic Floor Stretch - 1 x daily - 7 x weekly - 1 sets - 3 reps - 30 sec hold Supine Posterior Pelvic Tilt - 1 x daily - 7 x weekly - 3 sets - 10 reps

## 2021-05-11 NOTE — Therapy (Signed)
[facility] [address] [place], [place], [address] Phone: [phone]   Fax:  [phone]  Physical Therapy Treatment  Patient Details  Name: Yesenia Washington MRN: [id number] Date of Birth: 1941/09/03 Referring Provider (PT): Armbruster, Carlota Raspberry, MD   Encounter Date: 05/11/2021   PT End of Session - 05/11/21 1628     Visit Number 2    Date for PT Re-Evaluation 07/19/21    Authorization Type [employer]    PT Start Time 1622    PT Stop Time 1702    PT Time Calculation (min) 40 min    Activity Tolerance Patient tolerated treatment well    Behavior During Therapy [facility] for tasks assessed/performed             Past Medical History:  Diagnosis Date   Alcohol abuse    Allergy    Anxiety 2015   Arthritis    Carpal tunnel syndrome, bilateral 2014   CKD (chronic kidney disease) stage 3, GFR 30-59 ml/min (HCC)    COPD (chronic obstructive pulmonary disease) ([place]) 2015   Depression 2016   hospitalized in 2019 for 1 week   Diverticulosis 09/2019   GERD (gastroesophageal reflux disease) 2007   Hx of melanoma of skin    Hypertension    Hypothyroid    IBS (irritable bowel syndrome)    Malignant melanoma ([place])    1986 and 1987   Mixed hyperlipidemia    Osteopenia 2000   Peripheral neuropathy 2015   hands and feet   Rocky Mountain spotted fever 1999   Scalp alopecia    Tubular adenoma 09/2019   Venous insufficiency     Past Surgical History:  Procedure Laterality Date   ABDOMINAL HYSTERECTOMY  1994   BLEPHAROPLASTY Bilateral 2009   BUNIONECTOMY Right 2018   [place]   COLONOSCOPY W/ POLYPECTOMY  09/2019   tubular adenoma   TONSILLECTOMY AND ADENOIDECTOMY  1951    There were no vitals filed for this visit.   Subjective Assessment - 05/11/21 1626     Subjective Pt states she has been walking less and having less leakage. I had intense diarrhea for several days after taking the medication,  but it has been less.    Patient Stated Goals stop haivng leakge    Currently in Pain? No/denies                               [facility] Adult PT Treatment/Exercise - 05/11/21 0001       Exercises   Exercises Lumbar      Lumbar Exercises: Stretches   Active Hamstring Stretch 2 reps;20 seconds    Double Knee to Chest Stretch 2 reps;20 seconds    Lower Trunk Rotation 3 reps;20 seconds    Pelvic Tilt 10 reps    Pelvic Tilt Limitations VC/TC for kegel and corect movement    Figure 4 Stretch 2 reps;10 seconds      Lumbar Exercises: Seated   Other Seated Lumbar Exercises ball tilt and circles; marching 5 x      Lumbar Exercises: Supine   Pelvic Tilt 10 reps    Pelvic Tilt Limitations repeat at end of session with less cues to no cues                    PT Education - 05/11/21 1709     Education Details Access Code: [email or web address]  Person(s) Educated Patient    Methods Explanation;Demonstration;Tactile cues;Verbal cues;Handout    Comprehension Verbalized understanding;Returned demonstration              PT Short Term Goals - 04/26/21 1436       PT SHORT TERM GOAL #1   Title ind with initial HEP    Time 4    Period Weeks    Status New    Target Date 05/24/21               PT Long Term Goals - 04/26/21 1112       PT LONG TERM GOAL #1   Title Pt will report having 1-2 BM at most per day    Baseline 3-5    Time 12    Period Weeks    Status New    Target Date 07/19/21      PT LONG TERM GOAL #2   Title Pt will report having normal BM at least 50% of the time    Baseline not having normal feeling stool    Time 12    Period Weeks    Status New    Target Date 07/19/21      PT LONG TERM GOAL #3   Title Pt reports she is able to take a walk or sleep through the night without leakage    Time 12    Period Weeks    Status New    Target Date 07/19/21      PT LONG TERM GOAL #4   Title Pt will be ind with advanced HEP    Time 12     Period Weeks    Status New    Target Date 07/19/21      PT LONG TERM GOAL #5   Title Pt will report she needs only one panty line at most    Baseline medium pad 2-3    Time 12    Status New    Target Date 07/19/21                   Plan - 05/11/21 1658     Clinical Impression Statement Pt did well with exercises today.  Pt was given initial HEP.  She had a hard time with pelvic tilts and needed a lot of VC and TC but able to do them at the end of treatment without cues.  Pt needed TC in supine to ensure she was doing kegel correctly.  Added kegel to pelvic tilt but main focus in on stretching initially due to a lot of tension.  Pt will benefit from skilled PT to address strength and coordination for improved continence.    PT Treatment/Interventions ADLs/Self Care Home Management;Biofeedback;Cryotherapy;Electrical Stimulation;Moist Heat;Neuromuscular re-education;Therapeutic exercise;Therapeutic activities;Manual techniques;Patient/family education;Taping;Dry needling;Passive range of motion    PT Next Visit Plan f/u on stretches and pelvic tilt; thoracic ext/rotation, progress kegel isolation    PT Home Exercise Plan Access Code: [email or web address]             Patient will benefit from skilled therapeutic intervention in order to improve the following deficits and impairments:  Postural dysfunction, Impaired flexibility, Decreased strength, Decreased endurance, Decreased range of motion, Decreased coordination  Visit Diagnosis: Muscle weakness (generalized)  Unspecified lack of coordination  Abnormal posture     Problem List Patient Active Problem List   Diagnosis Date Noted   Major depressive disorder, single episode, severe without psychosis ([place]) 02/22/2018   Knee fracture, left 06/21/2009    [name]  [name], PT 05/11/2021, 5:18 PM  [facility] [address], [place], [place], [address] Phone:  [phone]   Fax:  [phone]  Name: Yesenia Washington MRN: [id number] Date of Birth: Apr 07, 1941

## 2021-06-09 ENCOUNTER — Other Ambulatory Visit: Payer: Self-pay

## 2021-06-09 ENCOUNTER — Ambulatory Visit: Payer: Medicare PPO | Attending: Gastroenterology | Admitting: Physical Therapy

## 2021-06-09 ENCOUNTER — Encounter: Payer: Self-pay | Admitting: Physical Therapy

## 2021-06-09 DIAGNOSIS — R293 Abnormal posture: Secondary | ICD-10-CM | POA: Insufficient documentation

## 2021-06-09 DIAGNOSIS — M6281 Muscle weakness (generalized): Secondary | ICD-10-CM | POA: Insufficient documentation

## 2021-06-09 DIAGNOSIS — R279 Unspecified lack of coordination: Secondary | ICD-10-CM | POA: Insufficient documentation

## 2021-06-09 NOTE — Patient Instructions (Signed)
Access Code: [email or web address] URL: [email or web address] Date: 06/09/2021 Prepared by: Jari Favre  Exercises Supine Hamstring Stretch with Strap - 1 x daily - 7 x weekly - 1 sets - 3 reps - 30 sec hold Supine Lower Trunk Rotation - 1 x daily - 7 x weekly - 1 sets - 10 reps - 5 sec hold Supine Piriformis Stretch - 1 x daily - 7 x weekly - 1 sets - 3 reps - 30 sec hold Supine Pelvic Floor Stretch - 1 x daily - 7 x weekly - 1 sets - 3 reps - 30 sec hold Supine Posterior Pelvic Tilt - 1 x daily - 7 x weekly - 3 sets - 10 reps Sidelying Thoracic Rotation with Open Book - 1 x daily - 7 x weekly - 5 reps - 1 sets - 10 sec hold Thoracic Extension Mobilization with Noodle - 1 x daily - 7 x weekly - 3 sets - 10 reps Hooklying Clamshells with Resistance - 1 x daily - 7 x weekly - 3 sets - 10 reps Beginner Bridge - 1 x daily - 7 x weekly - 3 sets - 10 reps Sidelying Pelvic Floor Contraction with Self-Palpation - 1 x daily - 7 x weekly - 3 sets - 10 reps

## 2021-06-09 NOTE — Therapy (Signed)
[facility] [address], [place], [place], [address] Phone: [phone]   Fax:  [phone]  Physical Therapy Treatment  Patient Details  Name: Yesenia Washington MRN: [id number] Date of Birth: 04-12-1941 Referring Provider (PT): Armbruster, Carlota Raspberry, MD   Encounter Date: 06/09/2021   PT End of Session - 06/09/21 1401     Visit Number 3    Date for PT Re-Evaluation 07/19/21    Authorization Type [employer]    PT Start Time 1400    PT Stop Time 1440    PT Time Calculation (min) 40 min    Activity Tolerance Patient tolerated treatment well    Behavior During Therapy [facility] for tasks assessed/performed             Past Medical History:  Diagnosis Date   Alcohol abuse    Allergy    Anxiety 2015   Arthritis    Carpal tunnel syndrome, bilateral 2014   CKD (chronic kidney disease) stage 3, GFR 30-59 ml/min (HCC)    COPD (chronic obstructive pulmonary disease) ([place]) 2015   Depression 2016   hospitalized in 2019 for 1 week   Diverticulosis 09/2019   GERD (gastroesophageal reflux disease) 2007   Hepatic steatosis 2022   Hx of melanoma of skin    Hypertension    Hypothyroid    IBS (irritable bowel syndrome)    Malignant melanoma ([place])    1986 and 1987   Mixed hyperlipidemia    Osteopenia 2000   Peripheral neuropathy 2015   hands and feet   Rocky Mountain spotted fever 1999   Scalp alopecia    Tubular adenoma 09/2019   Venous insufficiency     Past Surgical History:  Procedure Laterality Date   ABDOMINAL HYSTERECTOMY  1994   BLEPHAROPLASTY Bilateral 2009   BUNIONECTOMY Right 2018   [place]   COLONOSCOPY W/ POLYPECTOMY  09/2019   tubular adenoma   TONSILLECTOMY AND ADENOIDECTOMY  1951    There were no vitals filed for this visit.   Subjective Assessment - 06/09/21 1404     Subjective I had covid and still feeling a little crummy.  Pt states she had diarrhea and gas since she went back  on a medicine that can cause that.    Patient Stated Goals stop haivng leakge    Currently in Pain? No/denies                               [facility] Adult PT Treatment/Exercise - 06/09/21 0001       Neuro Re-ed    Neuro Re-ed Details  TC throughout to ensure resting all the way between each rep      Lumbar Exercises: Stretches   Other Lumbar Stretch Exercise thoracic ext and rotation - 30 sec      Lumbar Exercises: Supine   Pelvic Tilt 10 reps    Clam 20 reps    Bridge 20 reps    Bridge Limitations mini bridge    Other Supine Lumbar Exercises ball squeeze      Lumbar Exercises: Sidelying   Clam Right;Left;20 reps                    PT Education - 06/09/21 1448     Education Details Access Code: [email or web address]    Person(s) Educated Patient    Methods Explanation;Demonstration;Tactile cues;Verbal cues;Handout    Comprehension Verbalized understanding;Returned  demonstration              PT Short Term Goals - 06/09/21 1425       PT SHORT TERM GOAL #1   Title ind with initial HEP    Status Achieved               PT Long Term Goals - 04/26/21 1112       PT LONG TERM GOAL #1   Title Pt will report having 1-2 BM at most per day    Baseline 3-5    Time 12    Period Weeks    Status New    Target Date 07/19/21      PT LONG TERM GOAL #2   Title Pt will report having normal BM at least 50% of the time    Baseline not having normal feeling stool    Time 12    Period Weeks    Status New    Target Date 07/19/21      PT LONG TERM GOAL #3   Title Pt reports she is able to take a walk or sleep through the night without leakage    Time 12    Period Weeks    Status New    Target Date 07/19/21      PT LONG TERM GOAL #4   Title Pt will be ind with advanced HEP    Time 12    Period Weeks    Status New    Target Date 07/19/21      PT LONG TERM GOAL #5   Title Pt will report she needs only one panty line at most    Baseline medium  pad 2-3    Time 12    Status New    Target Date 07/19/21                   Plan - 06/09/21 1425     Clinical Impression Statement Pt did well with exercises today.  She has a hard time feeling the anus tightening when doing the kegel and needed tactile cues to make sure she is doing correctly. Pt was able to add exercises and able to work on pelvic and core strength throuhgout with stretches intermittent. Pt will benefit from skilled PT to continue working on strength and endurance.    Comorbidities hysterectomy, csection '83    PT Treatment/Interventions ADLs/Self Care Home Management;Biofeedback;Cryotherapy;Electrical Stimulation;Moist Heat;Neuromuscular re-education;Therapeutic exercise;Therapeutic activities;Manual techniques;Patient/family education;Taping;Dry needling;Passive range of motion    PT Next Visit Plan f/u on adding more kegels to HEP; sitting with TC for kegel    PT Home Exercise Plan Access Code: [email or web address]    Consulted and Agree with Plan of Care Patient             Patient will benefit from skilled therapeutic intervention in order to improve the following deficits and impairments:  Postural dysfunction, Impaired flexibility, Decreased strength, Decreased endurance, Decreased range of motion, Decreased coordination  Visit Diagnosis: Muscle weakness (generalized)  Unspecified lack of coordination  Abnormal posture     Problem List Patient Active Problem List   Diagnosis Date Noted   Major depressive disorder, single episode, severe without psychosis ([place]) 02/22/2018   Knee fracture, left 06/21/2009    Jule Ser, PT 06/09/2021, 2:51 PM  [facility] [address], [place], [place], [address] Phone: [phone]   Fax:  [phone]  Name: Yesenia Washington MRN: [id number] Date of  Birth: 29-Nov-1940

## 2021-06-14 ENCOUNTER — Ambulatory Visit: Payer: Medicare PPO | Admitting: Physical Therapy

## 2021-06-14 ENCOUNTER — Other Ambulatory Visit: Payer: Self-pay

## 2021-06-14 ENCOUNTER — Encounter: Payer: Self-pay | Admitting: Physical Therapy

## 2021-06-14 DIAGNOSIS — M6281 Muscle weakness (generalized): Secondary | ICD-10-CM | POA: Diagnosis not present

## 2021-06-14 DIAGNOSIS — R293 Abnormal posture: Secondary | ICD-10-CM

## 2021-06-14 DIAGNOSIS — R279 Unspecified lack of coordination: Secondary | ICD-10-CM

## 2021-06-14 NOTE — Therapy (Signed)
[facility] [address] [place], [place], [place], [address] Phone: [phone]   Fax:  [phone]  Physical Therapy Treatment  Patient Details  Name: Yesenia Washington MRN: [id number] Date of Birth: [birth year] Referring Provider (PT): Armbruster, Carlota Raspberry, MD   Encounter Date: 06/14/2021   PT End of Session - 06/14/21 1615     Visit Number 4    Date for PT Re-Evaluation 07/19/21    Authorization Type [employer]    PT Start Time 1410    PT Stop Time 1450    PT Time Calculation (min) 40 min    Activity Tolerance Patient tolerated treatment well    Behavior During Therapy [facility] for tasks assessed/performed             Past Medical History:  Diagnosis Date   Alcohol abuse    Allergy    Anxiety 2015   Arthritis    Carpal tunnel syndrome, bilateral 2014   CKD (chronic kidney disease) stage 3, GFR 30-59 ml/min (HCC)    COPD (chronic obstructive pulmonary disease) ([place]) 2015   Depression 2016   hospitalized in 2019 for 1 week   Diverticulosis 09/2019   GERD (gastroesophageal reflux disease) 2007   Hepatic steatosis 2022   Hx of melanoma of skin    Hypertension    Hypothyroid    IBS (irritable bowel syndrome)    Malignant melanoma ([place])    1986 and 1987   Mixed hyperlipidemia    Osteopenia 2000   Peripheral neuropathy 2015   hands and feet   Rocky Mountain spotted fever 1999   Scalp alopecia    Tubular adenoma 09/2019   Venous insufficiency     Past Surgical History:  Procedure Laterality Date   ABDOMINAL HYSTERECTOMY  1994   BLEPHAROPLASTY Bilateral 2009   BUNIONECTOMY Right 2018   [place]   COLONOSCOPY W/ POLYPECTOMY  09/2019   tubular adenoma   TONSILLECTOMY AND ADENOIDECTOMY  1951    There were no vitals filed for this visit.   Subjective Assessment - 06/14/21 1615     Subjective Pt has been having worse diarrhea and fatigue so it is hard to assess progress.  The diarrhea is a side  effect of the medicine I am taking    Patient Stated Goals stop haivng leakge    Currently in Pain? No/denies                               [facility] Adult PT Treatment/Exercise - 06/14/21 0001       Lumbar Exercises: Stretches   Other Lumbar Stretch Exercise pec stretch - 3 x30 sec      Lumbar Exercises: Aerobic   UBE (Upper Arm Bike) L1 3/3 fwd/back - core active and shoulders down      Lumbar Exercises: Standing   Row Strengthening;20 reps;Theraband   blue   Shoulder Extension Strengthening;20 reps;Theraband    Theraband Level (Shoulder Extension) Level 2 (Red)    Other Standing Lumbar Exercises scap squeeze with foam roll    Other Standing Lumbar Exercises wall slide                    PT Education - 06/14/21 1714     Education Details added row and ext with band    Person(s) Educated Patient    Methods Explanation;Demonstration;Tactile cues;Verbal cues;Handout    Comprehension Verbalized understanding;Returned demonstration  PT Short Term Goals - 06/09/21 1425       PT SHORT TERM GOAL #1   Title ind with initial HEP    Status Achieved               PT Long Term Goals - 06/14/21 1619       PT LONG TERM GOAL #1   Title Pt will report having 1-2 BM at most per day    Baseline 5-6    Status On-going      PT LONG TERM GOAL #2   Title Pt will report having normal BM at least 50% of the time    Baseline having had yet    Status On-going      PT LONG TERM GOAL #3   Title Pt reports she is able to take a walk or sleep through the night without leakage    Baseline not walking now, I usually have leakage early in the morning it comes out with gas    Status On-going      PT LONG TERM GOAL #4   Title Pt will be ind with advanced HEP    Status On-going                   Plan - 06/14/21 1707     Clinical Impression Statement Pt did well with exercises today.  Today's session focused more on postural  strengthening.  Pt needed cues to keep shoulders down and back and relax upper traps.  Posture strength needed to get improved core and pelvic floor muscle activation.  Pt will benefit from skilled PT to continue working towards functional goals.    PT Treatment/Interventions ADLs/Self Care Home Management;Biofeedback;Cryotherapy;Electrical Stimulation;Moist Heat;Neuromuscular re-education;Therapeutic exercise;Therapeutic activities;Manual techniques;Patient/family education;Taping;Dry needling;Passive range of motion    PT Next Visit Plan biofeedback? f/u with kegels and TC to check on ability to do without holding breath, sitting with TC to assess kegel    PT Home Exercise Plan Access Code: [email or web address]    Consulted and Agree with Plan of Care Patient             Patient will benefit from skilled therapeutic intervention in order to improve the following deficits and impairments:  Postural dysfunction, Impaired flexibility, Decreased strength, Decreased endurance, Decreased range of motion, Decreased coordination  Visit Diagnosis: Muscle weakness (generalized)  Unspecified lack of coordination  Abnormal posture     Problem List Patient Active Problem List   Diagnosis Date Noted   Major depressive disorder, single episode, severe without psychosis ([place]) 02/22/2018   Knee fracture, left 06/21/2009    Jule Ser, PT 06/14/2021, 5:14 PM  [facility] [address] [place], [place], [address] Phone: [phone]   Fax:  [phone]  Name: Yesenia Washington MRN: [id number] Date of Birth: 1941/09/02

## 2021-06-16 ENCOUNTER — Telehealth: Payer: Self-pay | Admitting: Gastroenterology

## 2021-06-16 NOTE — Telephone Encounter (Signed)
broPatient called states she was given Zenpep and now she has really bad diarrhea please call her to advise.

## 2021-06-16 NOTE — Telephone Encounter (Signed)
The pt states that she started ZenPep and began to have liquid diarrhea.  She stopped the ZenPep when she was treated for COVID and the diarrhea got some better.  When she began to take the ZenPep again she started having the watery diarrhea again. Please advise

## 2021-06-16 NOTE — Telephone Encounter (Signed)
The pt has been advised and will stop ZenPep.  She states she has been diagnosed with COVID again and will call back to make a follow up when she is better.

## 2021-06-16 NOTE — Telephone Encounter (Signed)
Okay. If she feels the Zenpep is making her worse she can stop it. Had it helped her previously? I gave this to her a few months ago. I had also referred her to pelvic floor PT. She is due for an office visit if you can help coordinate otherwise so we can discuss options. thanks

## 2021-06-17 ENCOUNTER — Ambulatory Visit: Payer: Medicare PPO | Admitting: Gastroenterology

## 2021-06-21 ENCOUNTER — Ambulatory Visit: Payer: Medicare PPO | Admitting: Physical Therapy

## 2021-06-28 ENCOUNTER — Ambulatory Visit: Payer: Medicare PPO | Attending: Gastroenterology | Admitting: Physical Therapy

## 2021-06-28 ENCOUNTER — Other Ambulatory Visit: Payer: Self-pay

## 2021-06-28 ENCOUNTER — Encounter: Payer: Self-pay | Admitting: Physical Therapy

## 2021-06-28 DIAGNOSIS — M6281 Muscle weakness (generalized): Secondary | ICD-10-CM | POA: Insufficient documentation

## 2021-06-28 DIAGNOSIS — R293 Abnormal posture: Secondary | ICD-10-CM | POA: Insufficient documentation

## 2021-06-28 DIAGNOSIS — R279 Unspecified lack of coordination: Secondary | ICD-10-CM | POA: Insufficient documentation

## 2021-06-28 NOTE — Therapy (Signed)
[facility] [address], [place], [place], [address] Phone: [phone]   Fax:  [phone]  Physical Therapy Treatment  Patient Details  Name: Yesenia Washington MRN: [id number] Date of Birth: 1941/02/28 Referring Provider (PT): Armbruster, Carlota Raspberry, MD   Encounter Date: 06/28/2021   PT End of Session - 06/28/21 1528     Visit Number 5    Date for PT Re-Evaluation 07/19/21    Authorization Type [employer]    PT Start Time [id number]    PT Stop Time 1528    PT Time Calculation (min) 43 min    Activity Tolerance Patient tolerated treatment well    Behavior During Therapy [facility] for tasks assessed/performed             Past Medical History:  Diagnosis Date   Alcohol abuse    Allergy    Anxiety 2015   Arthritis    Carpal tunnel syndrome, bilateral 2014   CKD (chronic kidney disease) stage 3, GFR 30-59 ml/min (HCC)    COPD (chronic obstructive pulmonary disease) ([place]) 2015   Depression 2016   hospitalized in 2019 for 1 week   Diverticulosis 09/2019   GERD (gastroesophageal reflux disease) 2007   Hepatic steatosis 2022   Hx of melanoma of skin    Hypertension    Hypothyroid    IBS (irritable bowel syndrome)    Malignant melanoma ([place])    1986 and 1987   Mixed hyperlipidemia    Osteopenia 2000   Peripheral neuropathy 2015   hands and feet   Rocky Mountain spotted fever 1999   Scalp alopecia    Tubular adenoma 09/2019   Venous insufficiency     Past Surgical History:  Procedure Laterality Date   ABDOMINAL HYSTERECTOMY  1994   BLEPHAROPLASTY Bilateral 2009   BUNIONECTOMY Right 2018   [place]   COLONOSCOPY W/ POLYPECTOMY  09/2019   tubular adenoma   TONSILLECTOMY AND ADENOIDECTOMY  1951    There were no vitals filed for this visit.   Subjective Assessment - 06/28/21 1451     Subjective Pt had a dear friend pass away, had a positive covid test but states it happens sometimes but her  symptoms were worse.    Patient Stated Goals stop haivng leakge    Currently in Pain? No/denies                               [facility] Adult PT Treatment/Exercise - 06/28/21 0001       Neuro Re-ed    Neuro Re-ed Details  kegel in supine, sitting- 4 sec hold and 8 sec relax      Lumbar Exercises: Stretches   Active Hamstring Stretch 2 reps;20 seconds    Double Knee to Chest Stretch 2 reps;20 seconds    Other Lumbar Stretch Exercise thoracic rotation                      PT Short Term Goals - 06/09/21 1425       PT SHORT TERM GOAL #1   Title ind with initial HEP    Status Achieved               PT Long Term Goals - 06/28/21 1652       PT LONG TERM GOAL #1   Title Pt will report having 1-2 BM at most per day  Status On-going      PT LONG TERM GOAL #2   Title Pt will report having normal BM at least 50% of the time    Status On-going      PT LONG TERM GOAL #3   Title Pt reports she is able to take a walk or sleep through the night without leakage    Status On-going      PT LONG TERM GOAL #4   Title Pt will be ind with advanced HEP    Status On-going                   Plan - 06/28/21 1640     Clinical Impression Statement Pt did well with biofeedback. Pt sat at a higher than average, 7-75m, resting tone.  Pt became fatigued towards the end of the session and unable to contract with same intensity.  Pt did better with relaxing more quickly after practicing with the biofeedback.  Pt was given exercise to work on kegel in sitting with longer rest periods so she can improve variability in contraction and relaxation and work on endurance.    PT Treatment/Interventions ADLs/Self Care Home Management;Biofeedback;Cryotherapy;Electrical Stimulation;Moist Heat;Neuromuscular re-education;Therapeutic exercise;Therapeutic activities;Manual techniques;Patient/family education;Taping;Dry needling;Passive range of motion    PT Next Visit  Plan biofeedback #2;  check that she is no bulging, holding breath, and try in standing positions    PT Home Exercise Plan Access Code: [email or web address]    Consulted and Agree with Plan of Care Patient             Patient will benefit from skilled therapeutic intervention in order to improve the following deficits and impairments:  Postural dysfunction, Impaired flexibility, Decreased strength, Decreased endurance, Decreased range of motion, Decreased coordination  Visit Diagnosis: Muscle weakness (generalized)  Unspecified lack of coordination  Abnormal posture     Problem List Patient Active Problem List   Diagnosis Date Noted   Major depressive disorder, single episode, severe without psychosis ([place] 02/22/2018   Knee fracture, left 06/21/2009    JJule Ser PT 06/28/2021, 4:58 PM  [facility] [address] [place] [address]Phone: [phone]  Fax:  [phone] Name: Yesenia CARVAJALMRN: [id number] of Birth: 1Nov 17, 1942

## 2021-07-07 ENCOUNTER — Encounter: Payer: Self-pay | Admitting: Physical Therapy

## 2021-07-07 ENCOUNTER — Other Ambulatory Visit: Payer: Self-pay

## 2021-07-07 ENCOUNTER — Ambulatory Visit: Payer: Medicare PPO | Admitting: Physical Therapy

## 2021-07-07 DIAGNOSIS — M6281 Muscle weakness (generalized): Secondary | ICD-10-CM | POA: Diagnosis not present

## 2021-07-07 DIAGNOSIS — R279 Unspecified lack of coordination: Secondary | ICD-10-CM

## 2021-07-07 DIAGNOSIS — R293 Abnormal posture: Secondary | ICD-10-CM

## 2021-07-07 NOTE — Therapy (Signed)
[facility] [address], [place], [place], [address] Phone: [phone]   Fax:  [phone]  Physical Therapy Treatment  Patient Details  Name: Yesenia Washington MRN: [id number] Date of Birth: 08/28/1941 Referring Provider (PT): Armbruster, Carlota Raspberry, MD   Encounter Date: 07/07/2021   PT End of Session - 07/07/21 1401     Visit Number 6    Date for PT Re-Evaluation 07/19/21    Authorization Type [employer]    PT Start Time 2330    PT Stop Time 0762    PT Time Calculation (min) 40 min    Activity Tolerance Patient tolerated treatment well    Behavior During Therapy [facility] for tasks assessed/performed             Past Medical History:  Diagnosis Date   Alcohol abuse    Allergy    Anxiety 2015   Arthritis    Carpal tunnel syndrome, bilateral 2014   CKD (chronic kidney disease) stage 3, GFR 30-59 ml/min (HCC)    COPD (chronic obstructive pulmonary disease) ([place]) 2015   Depression 2016   hospitalized in 2019 for 1 week   Diverticulosis 09/2019   GERD (gastroesophageal reflux disease) 2007   Hepatic steatosis 2022   Hx of melanoma of skin    Hypertension    Hypothyroid    IBS (irritable bowel syndrome)    Malignant melanoma ([place])    1986 and 1987   Mixed hyperlipidemia    Osteopenia 2000   Peripheral neuropathy 2015   hands and feet   Rocky Mountain spotted fever 1999   Scalp alopecia    Tubular adenoma 09/2019   Venous insufficiency     Past Surgical History:  Procedure Laterality Date   ABDOMINAL HYSTERECTOMY  1994   BLEPHAROPLASTY Bilateral 2009   BUNIONECTOMY Right 2018   [place]   COLONOSCOPY W/ POLYPECTOMY  09/2019   tubular adenoma   TONSILLECTOMY AND ADENOIDECTOMY  1951    There were no vitals filed for this visit.   Subjective Assessment - 07/07/21 1414     Subjective I am trying to get back into swimming but the covid recovery is slowing it down.  Walking is just when  doing errands and had a very small amount of leakage. I am still working on regulating medication.    Patient Stated Goals stop haivng leakge    Currently in Pain? No/denies                               [facility] Adult PT Treatment/Exercise - 07/07/21 0001       Neuro Re-ed    Neuro Re-ed Details  kegel with biofeedback during all activities - beginning with 4 sec hold, 4 sec rest - needed VC and TC to exhale and engage core      Lumbar Exercises: Standing   Other Standing Lumbar Exercises tandem kegel both ways      Lumbar Exercises: Seated   Other Seated Lumbar Exercises kegel sitting    Other Seated Lumbar Exercises kegel with ball squeeze      Lumbar Exercises: Supine   Dead Bug 20 reps   alternating UE/LE with knee press                   PT Education - 07/07/21 [place]     Education Details Access Code: [email or web address]    Person(s) Educated Patient  Methods Explanation;Demonstration;Tactile cues;Verbal cues;Handout    Comprehension Verbalized understanding;Returned demonstration              PT Short Term Goals - 06/09/21 1425       PT SHORT TERM GOAL #1   Title ind with initial HEP    Status Achieved               PT Long Term Goals - 07/07/21 1416       PT LONG TERM GOAL #1   Title Pt will report having 1-2 BM at most per day    Status On-going      PT LONG TERM GOAL #3   Title Pt reports she is able to take a walk or sleep through the night without leakage    Baseline I have not been leaking much at night, a little leakage when at the store yesterday both are better    Status Partially Met                   Plan - 07/07/21 1445     Clinical Impression Statement Pt did well with biofeedback.  She needed cues to coordinate her breathing and given cues to exhale with exertion as she tends to push out and inhale when doing the kegel.  Pt was able to progress her exercises to sitting and standing today.  Pt was given  updates to HEP as seen in chart.  Overall, she has noticed less BMs and less leakage.  Steadily working towards goals at this time so she is expected to continue to make progress.    PT Treatment/Interventions ADLs/Self Care Home Management;Biofeedback;Cryotherapy;Electrical Stimulation;Moist Heat;Neuromuscular re-education;Therapeutic exercise;Therapeutic activities;Manual techniques;Patient/family education;Taping;Dry needling;Passive range of motion    PT Next Visit Plan biofeedback #3 if needed;  check that she is not bulging, holding breath, and try in standing positions and    PT Home Exercise Plan Access Code: [email or web address]    Consulted and Agree with Plan of Care Patient             Patient will benefit from skilled therapeutic intervention in order to improve the following deficits and impairments:  Postural dysfunction, Impaired flexibility, Decreased strength, Decreased endurance, Decreased range of motion, Decreased coordination  Visit Diagnosis: Muscle weakness (generalized)  Unspecified lack of coordination  Abnormal posture     Problem List Patient Active Problem List   Diagnosis Date Noted   Major depressive disorder, single episode, severe without psychosis ([place]) 02/22/2018   Knee fracture, left 06/21/2009    Jule Ser, PT 07/07/2021, 3:21 PM  [facility] [address] [place], [place], [address] Phone: [phone]   Fax:  [phone]  Name: Yesenia Washington MRN: [id number] Date of Birth: 14-Mar-1941

## 2021-07-07 NOTE — Patient Instructions (Signed)
Access Code: [email or web address] URL: [email or web address] Date: 07/07/2021 Prepared by: Jari Favre  Exercises Supine Hamstring Stretch with Strap - 1 x daily - 7 x weekly - 1 sets - 3 reps - 30 sec hold Supine Lower Trunk Rotation - 1 x daily - 7 x weekly - 1 sets - 10 reps - 5 sec hold Supine Piriformis Stretch - 1 x daily - 7 x weekly - 1 sets - 3 reps - 30 sec hold Supine Pelvic Floor Stretch - 1 x daily - 7 x weekly - 1 sets - 3 reps - 30 sec hold Supine Posterior Pelvic Tilt - 1 x daily - 7 x weekly - 3 sets - 10 reps Sidelying Thoracic Rotation with Open Book - 1 x daily - 7 x weekly - 5 reps - 1 sets - 10 sec hold Thoracic Extension Mobilization with Noodle - 1 x daily - 7 x weekly - 3 sets - 10 reps Hooklying Clamshells with Resistance - 1 x daily - 7 x weekly - 3 sets - 10 reps Beginner Bridge - 1 x daily - 7 x weekly - 3 sets - 10 reps Sidelying Pelvic Floor Contraction with Self-Palpation - 1 x daily - 7 x weekly - 3 sets - 10 reps Shoulder extension with resistance - Neutral - 1 x daily - 7 x weekly - 3 sets - 10 reps Standing Bilateral Low Shoulder Row with Anchored Resistance - 1 x daily - 7 x weekly - 3 sets - 10 reps Seated Kegel - 1 x daily - 7 x weekly - 3 sets - 10 reps Dead Bug - 1 x daily - 7 x weekly - 3 sets - 10 reps Tandem Stance - 1 x daily - 7 x weekly - 1 sets - 10 reps - 3 sec hold

## 2021-07-15 ENCOUNTER — Other Ambulatory Visit: Payer: Self-pay

## 2021-07-15 ENCOUNTER — Encounter: Payer: Self-pay | Admitting: Physical Therapy

## 2021-07-15 ENCOUNTER — Ambulatory Visit: Payer: Medicare PPO | Admitting: Physical Therapy

## 2021-07-15 DIAGNOSIS — M6281 Muscle weakness (generalized): Secondary | ICD-10-CM

## 2021-07-15 DIAGNOSIS — R279 Unspecified lack of coordination: Secondary | ICD-10-CM

## 2021-07-15 DIAGNOSIS — R293 Abnormal posture: Secondary | ICD-10-CM

## 2021-07-15 NOTE — Therapy (Signed)
[facility] [address] [place], [place], [place], [address] Phone: [phone]   Fax:  [phone]  Physical Therapy Treatment  Patient Details  Name: Yesenia Washington MRN: [id number] Date of Birth: 07-27-1941 Referring Provider (PT): Armbruster, Carlota Raspberry, MD   Encounter Date: 07/15/2021   PT End of Session - 07/15/21 1200     Visit Number 7    Date for PT Re-Evaluation 07/19/21    Authorization Type [employer]    PT Start Time 1150    PT Stop Time 2993    PT Time Calculation (min) 40 min    Activity Tolerance Patient tolerated treatment well    Behavior During Therapy [facility] for tasks assessed/performed             Past Medical History:  Diagnosis Date   Alcohol abuse    Allergy    Anxiety 2015   Arthritis    Carpal tunnel syndrome, bilateral 2014   CKD (chronic kidney disease) stage 3, GFR 30-59 ml/min (HCC)    COPD (chronic obstructive pulmonary disease) ([place]) 2015   Depression 2016   hospitalized in 2019 for 1 week   Diverticulosis 09/2019   GERD (gastroesophageal reflux disease) 2007   Hepatic steatosis 2022   Hx of melanoma of skin    Hypertension    Hypothyroid    IBS (irritable bowel syndrome)    Malignant melanoma ([place])    1986 and 1987   Mixed hyperlipidemia    Osteopenia 2000   Peripheral neuropathy 2015   hands and feet   Rocky Mountain spotted fever 1999   Scalp alopecia    Tubular adenoma 09/2019   Venous insufficiency     Past Surgical History:  Procedure Laterality Date   ABDOMINAL HYSTERECTOMY  1994   BLEPHAROPLASTY Bilateral 2009   BUNIONECTOMY Right 2018   [place]   COLONOSCOPY W/ POLYPECTOMY  09/2019   tubular adenoma   TONSILLECTOMY AND ADENOIDECTOMY  1951    There were no vitals filed for this visit.   Subjective Assessment - 07/15/21 1154     Subjective I have been swimming but I had a bad week intestinally. [place] the airport and had  issues due to the intestines    Patient Stated Goals stop haivng leakge    Currently in Pain? No/denies                               [facility] Adult PT Treatment/Exercise - 07/15/21 0001       Lumbar Exercises: Aerobic   Stationary Bike L1 x 5 min PT present for status update      Lumbar Exercises: Standing   Heel Raises Limitations rocking on foam mat    Other Standing Lumbar Exercises hip abduction, march on step, ext 1.5lb - 10 xeach      Lumbar Exercises: Supine   Straight Leg Raise 10 reps    Straight Leg Raises Limitations exhale on exertion    Other Supine Lumbar Exercises marching bent knee - exhale - 20x                      PT Short Term Goals - 06/09/21 1425       PT SHORT TERM GOAL #1   Title ind with initial HEP    Status Achieved  PT Long Term Goals - 07/15/21 1256       PT LONG TERM GOAL #1   Title Pt will report having 1-2 BM at most per day    Baseline has been less unless having a bad GI week    Status Partially Met      PT LONG TERM GOAL #2   Title Pt will report having normal BM at least 50% of the time    Status Not Met      PT LONG TERM GOAL #3   Title Pt reports she is able to take a walk or sleep through the night without leakage    Baseline I have not been leaking much at night, a little leakage when at the store yesterday both are better    Status Partially Met      PT LONG TERM GOAL #4   Title Pt will be ind with advanced HEP    Status Achieved      PT LONG TERM GOAL #5   Title Pt will report she needs only one panty line at most    Status Not Met                   Plan - 07/15/21 1249     Clinical Impression Statement Pt is doing well with exercises at home, but feels like stress and other issue with GI are overtaking her ability to control the BMs.  Pt was able to do better with breathing and not bulging when engaging the pelvic floor today.  Pt will d/c with HEP today.     PT Treatment/Interventions ADLs/Self Care Home Management;Biofeedback;Cryotherapy;Electrical Stimulation;Moist Heat;Neuromuscular re-education;Therapeutic exercise;Therapeutic activities;Manual techniques;Patient/family education;Taping;Dry needling;Passive range of motion    PT Next Visit Plan d/c today    PT Home Exercise Plan Access Code: [email or web address]    Consulted and Agree with Plan of Care Patient             Patient will benefit from skilled therapeutic intervention in order to improve the following deficits and impairments:  Postural dysfunction, Impaired flexibility, Decreased strength, Decreased endurance, Decreased range of motion, Decreased coordination  Visit Diagnosis: Muscle weakness (generalized)  Unspecified lack of coordination  Abnormal posture     Problem List Patient Active Problem List   Diagnosis Date Noted   Major depressive disorder, single episode, severe without psychosis ([place]) 02/22/2018   Knee fracture, left 06/21/2009    Jule Ser, PT 07/15/2021, 1:04 PM  [facility] [address] [place], [place], [address] Phone: [phone]   Fax:  [phone]  Name: Yesenia Washington MRN: [id number] Date of Birth: Dec 16, 1940  PHYSICAL THERAPY DISCHARGE SUMMARY  Visits from Start of Care: 7  Current functional level related to goals / functional outcomes: See above details   Remaining deficits: See above   Education / Equipment: HEP  Patient agrees to discharge. Patient goals were not met. Patient is being discharged due to lack of progress.  Gustavus Bryant, PT 07/15/21 1:05 PM

## 2021-10-18 ENCOUNTER — Encounter: Payer: Self-pay | Admitting: Gastroenterology

## 2021-10-18 ENCOUNTER — Ambulatory Visit: Payer: Medicare PPO | Admitting: Gastroenterology

## 2021-10-18 VITALS — BP 100/70 | HR 96 | Ht 65.0 in | Wt 141.0 lb

## 2021-10-18 DIAGNOSIS — R143 Flatulence: Secondary | ICD-10-CM

## 2021-10-18 DIAGNOSIS — K529 Noninfective gastroenteritis and colitis, unspecified: Secondary | ICD-10-CM | POA: Diagnosis not present

## 2021-10-18 DIAGNOSIS — R159 Full incontinence of feces: Secondary | ICD-10-CM

## 2021-10-18 MED ORDER — RIFAXIMIN 550 MG PO TABS: 550.0000 mg | ORAL_TABLET | Freq: Three times a day (TID) | ORAL | 0 refills | Status: AC

## 2021-10-18 NOTE — Progress Notes (Signed)
HPI :  80 year old female here for follow-up visit for chronic diarrhea and fecal incontinence.  I last saw her in April 2022.  At that time we reviewed her history of the symptoms and made recommendations on management.  I referred her to pelvic floor PT for low sphincter tone in light of her fecal incontinence.  She states she went through a course of therapy for that and continues to do exercises.  She was given a trial of low FODMAP diet.  I told her to take Imodium half tab every morning and titrate up as needed, stop her magnesium supplement.  We screened her for celiac disease with serology which was negative.  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  She submitted a fecal pancreatic elastase which was low at 112.  She was started on Zenpep.   Recall she was tried on Colestid and cholestyramine in the past and states it made bloating worse and she just did not tolerate it.   She states unfortunately a lot of her symptoms continue to persist.  She completed course of pelvic floor PT but she still has passive incontinence.  She states when walking she cannot feel it but stool leaking out of her.  Her stools are typically loose.  On a bad day she will have anywhere from 6-7 bowel movements in a day, on a good day 2-3 bowel movements per day.  On an average day about 3-4 loose stools per day.  She thinks low FODMAP diet may have had some mild benefit, she continues to stay on gluten-free and low lactose diet.  She is uncertain if the Zenpep is really helped her symptoms.  She had a course of COVID and required Paxil Oved, had to stop the Zenpep when she was on that.  Has since resumed it.  States she had 1 good week in November a few weeks ago where she felt pretty normal but since then her stools have since become loose again.  She also has gas that is quite bothersome to her.  She has tried Beano and Gas-X, none of which is really helped.  She has been taking a half  Imodium per day and states it does help her.  She is weary of constipation at 1 time a day and did not do that yet.  Prior workup has included.  TSH normal ESR normal CBC normal, no anemia or leukocytosis LFTs normal   Abdominal x ray - 03/11/20 - large stool burden   CT abdomen / pelvis 01/21/20 - moderate stool burden in right and transverse colon suggestive of constipation, liver is NORMAL   Colonoscopy 09/27/19 - one diminutive adenoma, random biopsies showed no evidence of microscopic colitis, normal ileum, low sphincter tone   US abdomen 01/17/20: IMPRESSION: Large nonspecific area of increased hepatic parenchymal echogenicity within the right lobe of the liver which may represent focal fatty deposition. Given the overall appearance however confirmation with pre and post contrast-enhanced MRI is recommended.   Hepatic steatosis.  TTG IgA negative Total IgA 129 Pancreatic fecal elastase 112 HLA DQ2 (+), HLA DQ8 negative  MRI abdomen / pelvis 03/01/21: IMPRESSION: 1. No acute findings or explanation for the patient's symptoms. 2. Sigmoid diverticulosis without evidence of diverticulitis. 3. Mild hepatic steatosis   Past Medical History:  Diagnosis Date   Alcohol abuse    Allergy    Anxiety 2015   Arthritis    Carpal tunnel syndrome, bilateral 2014  CKD (chronic kidney disease) stage 3, GFR 30-59 ml/min (HCC)    COPD (chronic obstructive pulmonary disease) ([place]) 2015   Depression 2016   hospitalized in 2019 for 1 week   Diverticulosis 09/2019   GERD (gastroesophageal reflux disease) 2007   Hepatic steatosis 2022   Hx of melanoma of skin    Hypertension    Hypothyroid    IBS (irritable bowel syndrome)    Malignant melanoma ([place])    1986 and 1987   Mixed hyperlipidemia    Osteopenia 2000   Peripheral neuropathy 2015   hands and feet   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[place] spotted fever 1999   Scalp alopecia    Tubular adenoma 09/2019   Venous insufficiency      Past Surgical  History:  Procedure Laterality Date   ABDOMINAL HYSTERECTOMY  1994   BLEPHAROPLASTY Bilateral 2009   BUNIONECTOMY Right 2018   [place]   COLONOSCOPY W/ POLYPECTOMY  09/2019   tubular adenoma   TONSILLECTOMY AND ADENOIDECTOMY  1951   Family History  Problem Relation Age of Onset   Atrial fibrillation Mother    Arthritis Mother    Dementia Mother    Prostate cancer Father 73   CAD Father 20   Atrial fibrillation Maternal Aunt    Heart disease Paternal Aunt    Heart disease Paternal Grandmother    Colon polyps Brother    Colon cancer Neg Hx    Pancreatic cancer Neg Hx    Esophageal cancer Neg Hx    Social History   Tobacco Use   Smoking status: Former   Smokeless tobacco: Never  Substance Use Topics   Alcohol use: Yes   Current Outpatient Medications  Medication Sig Dispense Refill   Alpha-D-Galactosidase (BEANO) TABS Take 1 tablet by mouth daily.     ALPRAZolam (XANAX) 1 MG tablet Take 1 mg by mouth at bedtime.     BIOTIN 5000 PO Take 1 tablet by mouth daily.     cetirizine (ZYRTEC) 10 MG tablet Take 10 mg by mouth daily.     cholecalciferol (VITAMIN D) 1000 units tablet Take 1,000 Units by mouth daily.     Coenzyme Q10 (COQ10) 200 MG CAPS Take 1 tablet by mouth daily.     levothyroxine (SYNTHROID, LEVOTHROID) 125 MCG tablet Take 125 mcg by mouth daily.     loperamide (IMODIUM A-D) 2 MG tablet Take 0.5 tablets (1 mg total) by mouth in the morning. Titrate as needed 30 tablet 0   losartan (COZAAR) 50 MG tablet Take 50 mg by mouth daily.     Pancrelipase, Lip-Prot-Amyl, (ZENPEP) 40000-126000 units CPEP Take 1 capsule (40,000 units total) by mouth three times daily with each meal AND take 1 capsule (40,000 units total) with each snack. 150 capsule 3   rosuvastatin (CRESTOR) 5 MG tablet Take 5 mg by mouth every other day.     Simethicone (GAS-X PO) Take 1 tablet by mouth daily.     vitamin B-12 (CYANOCOBALAMIN) 1000 MCG tablet Take 1,000 mcg by mouth daily.      No current facility-administered medications for this visit.   Allergies  Allergen Reactions   Darvon     Hallucinations   Oxycodone Hcl     Sedation    Pravastatin Other (See Comments)    myalgias   Simvastatin Other (See Comments)    Change in bowel habits     Review of Systems: All systems reviewed and negative except where noted in HPI.   Labs  per HPI  Physical Exam: BP 100/70   Pulse 96   Ht _0  (1.651 m)   Wt 141 lb (64 kg)   BMI 23.46 kg/m  Constitutional: Pleasant,well-developed, female in no acute distress. Neurological: Alert and oriented to person place and time. Psychiatric: Normal mood and affect. Behavior is normal.   ASSESSMENT AND PLAN: 80 y/o female here for reassessment of the following:  Chronic diarrhea Fecal incontinence Excessive gas  Reviewed work-up to date with her as above.  Unfortunately trial of pelvic floor PT has not helped with her fecal incontinence/leakage, which appears passive.  She is not unfortunately had improvement with Zenpep, unclear if her low fecal elastase is simply due to her chronic loose stool or if she truly has pancreatic exocrine insufficiency.  MRI abdomen showed normal pancreas.  Given lack of response to Zenpep, will try other modalities of treatment for now.  She has responded to Imodium at higher doses and can take 1 tab daily to see if that would help her more.  Given her excessive gas and bloating we will try her on rifaximin 550 mg 3 times daily for 2 weeks and see if that helps.  If this is not covered by insurance we will try to get her a free sample.  Given her passive leakage and ongoing incontinence I think anorectal manometry is the next step to clarify what is going on with the pelvic floor and determine if she needs more of an interventional therapy such as an injectable anal bulking agent. Hopefully if we can form up the stool that will minimize her leakage. If this regimen does not help with the diarrhea  will consider other options such as Viberzi. Overall I don't think likely she has celiac disease at this point but if gluten free helps her, she can continue it if she wishes.  Plan: - trial of Rifaximin 548m TID 2 weeks - increase Immodium to full tab daily and titrate up if needed - holding off on zenpep - anorectal manometry at hospital - further recommendations based on that result and her course.  SJolly Mango MD [facility]

## 2021-10-18 NOTE — Patient Instructions (Addendum)
If you are age 78 or older, your body mass index should be between 23-30. Your Body mass index is 23.46 kg/m. If this is out of the aforementioned range listed, please consider follow up with your Primary Care Provider.  If you are age 63 or younger, your body mass index should be between 19-25. Your Body mass index is 23.46 kg/m. If this is out of the aformentioned range listed, please consider follow up with your Primary Care Provider.   ________________________________________________________  The [facility] providers would like to encourage you to use [facility] to communicate with providers for non-urgent requests or questions.  Due to long hold times on the telephone, sending your provider a message by [facility] may be a faster and more efficient way to get a response.  Please allow 48 business hours for a response.  Please remember that this is for non-urgent requests.  _______________________________________________________  We have sent the following medications to [facility] pharmacy for you to pick up at your convenience: Xifaxan 550 mg: Take 3 times a day for 14 days.   This pharmacy is sometimes able to get medication approved through insurance and get you the lowest copay possible. If you have not heard from them within 1- 2 weeks, please call our office at [phone] to let us know.  Take imodium as needed.  For now, hold ZenPep. _______________________________________________________________  Dennis Bast have been scheduled to have an anorectal manometry at [facility] on 12-17-21 at 12:30pm. Please arrive 30 minutes prior to your appointment time for registration (1st floor of the hospital-admissions).  Please make certain to use 1 Fleets enema 2 hours prior to coming for your appointment. You can purchase Fleets enemas from the laxative section at your drug store. You should not eat anything during the two hours prior to the procedure. You may take regular medications with small sips  of water at least 2 hours prior to the study.  Anorectal manometry is a test performed to evaluate patients with constipation or fecal incontinence. This test measures the pressures of the anal sphincter muscles, the sensation in the rectum, and the neural reflexes that are needed for normal bowel movements.  THE PROCEDURE The test takes approximately 30 minutes to 1 hour. You will be asked to change into a hospital gown. A technician or nurse will explain the procedure to you, take a brief health history, and answer any questions you may have. The patient then lies on his or her left side. A small, flexible tube, about the size of a thermometer, with a balloon at the end is inserted into the rectum. The catheter is connected to a machine that measures the pressure. During the test, the small balloon attached to the catheter may be inflated in the rectum to assess the normal reflex pathways. The nurse or technician may also ask the person to squeeze, relax, and push at various times. The anal sphincter muscle pressures are measured during each of these maneuvers. To squeeze, the patient tightens the sphincter muscles as if trying to prevent anything from coming out. To push or bear down, the patient strains down as if trying to have a bowel movement.     Thank you for entrusting me with your care and for choosing [facility], Dr. Peabody Cellar

## 2021-11-03 ENCOUNTER — Telehealth: Payer: Self-pay | Admitting: Gastroenterology

## 2021-11-03 NOTE — Telephone Encounter (Signed)
Received a call from [employer] needing a prior authorization for the Xifaxan for patient.  Callback number is [id number], ref # [id number].  Thank you.

## 2021-11-03 NOTE — Telephone Encounter (Signed)
Script was sent to [facility] on 11-28.  Called them at [phone] and spoke to [place]. She said there was an old PA in place that had to be voided before they could do a new one. PA submitted today. Per Vikki Ports we should know something within a few days

## 2021-11-04 NOTE — Telephone Encounter (Signed)
Rec'd an APPROVAL For Xifaxan (effective) 11-04-21  through 02-01-22) from [facility]. Patient co-pay $100.

## 2021-11-22 ENCOUNTER — Encounter: Payer: Self-pay | Admitting: Gastroenterology

## 2021-11-23 NOTE — Telephone Encounter (Signed)
Per fax from [place] to ship to patient on 11-24-21

## 2021-12-08 ENCOUNTER — Encounter (HOSPITAL_COMMUNITY): Payer: Self-pay | Admitting: Gastroenterology

## 2021-12-08 NOTE — Progress Notes (Signed)
Attempted to obtain medical history via telephone, unable to reach at this time. I left a voicemail to return pre surgical testing department's phone call.  

## 2021-12-17 ENCOUNTER — Encounter (HOSPITAL_COMMUNITY)
Admission: RE | Disposition: A | Discharge status: Home / Self Care | Payer: Self-pay | Source: Home / Self Care | Attending: Gastroenterology

## 2021-12-17 ENCOUNTER — Encounter (HOSPITAL_COMMUNITY): Payer: Self-pay | Admitting: Gastroenterology

## 2021-12-17 ENCOUNTER — Ambulatory Visit (HOSPITAL_COMMUNITY)
Admission: RE | Admit: 2021-12-17 | Discharge: 2021-12-17 | Disposition: A | Discharge status: Home / Self Care | Payer: Medicare PPO | Attending: Gastroenterology | Admitting: Gastroenterology

## 2021-12-17 DIAGNOSIS — K629 Disease of anus and rectum, unspecified: Secondary | ICD-10-CM | POA: Diagnosis not present

## 2021-12-17 DIAGNOSIS — R152 Fecal urgency: Secondary | ICD-10-CM | POA: Diagnosis not present

## 2021-12-17 DIAGNOSIS — R159 Full incontinence of feces: Secondary | ICD-10-CM | POA: Diagnosis not present

## 2021-12-17 HISTORY — PX: ANAL RECTAL MANOMETRY: SHX6358

## 2021-12-17 SURGERY — MANOMETRY, ANORECTAL

## 2021-12-17 NOTE — Progress Notes (Signed)
Anal rectal manometry performed per protocol without complications.  Patient tolerated well.  Balloon expulsion test performed per protocol without complications.  Patient tolerated well.  Expelled balloon after 1 minute.

## 2021-12-20 ENCOUNTER — Encounter (HOSPITAL_COMMUNITY): Payer: Self-pay | Admitting: Gastroenterology

## 2021-12-31 ENCOUNTER — Encounter: Payer: Self-pay | Admitting: Gastroenterology

## 2022-01-03 ENCOUNTER — Telehealth: Payer: Self-pay

## 2022-01-03 ENCOUNTER — Other Ambulatory Visit: Payer: Self-pay

## 2022-01-03 DIAGNOSIS — R159 Full incontinence of feces: Secondary | ICD-10-CM

## 2022-01-03 NOTE — Telephone Encounter (Signed)
See phone note from Anal Manometry result note

## 2022-01-18 ENCOUNTER — Ambulatory Visit: Payer: Medicare PPO | Attending: Gastroenterology | Admitting: Physical Therapy

## 2022-01-18 ENCOUNTER — Other Ambulatory Visit: Payer: Self-pay

## 2022-01-18 ENCOUNTER — Ambulatory Visit: Payer: Medicare PPO | Admitting: Physical Therapy

## 2022-01-18 DIAGNOSIS — R293 Abnormal posture: Secondary | ICD-10-CM

## 2022-01-18 DIAGNOSIS — R159 Full incontinence of feces: Secondary | ICD-10-CM | POA: Diagnosis not present

## 2022-01-18 DIAGNOSIS — R279 Unspecified lack of coordination: Secondary | ICD-10-CM | POA: Diagnosis not present

## 2022-01-18 DIAGNOSIS — R152 Fecal urgency: Secondary | ICD-10-CM

## 2022-01-18 DIAGNOSIS — M6281 Muscle weakness (generalized): Secondary | ICD-10-CM | POA: Diagnosis not present

## 2022-01-18 NOTE — Therapy (Signed)
OUTPATIENT PHYSICAL THERAPY FEMALE PELVIC EVALUATION   Patient Name: Yesenia Washington MRN: [id number] DOB:1941-09-28, 81 y.o., female Today's Date: 01/18/2022    Past Medical History:  Diagnosis Date   Alcohol abuse    Allergy    Anxiety 2015   Arthritis    Carpal tunnel syndrome, bilateral 2014   CKD (chronic kidney disease) stage 3, GFR 30-59 ml/min (HCC)    COPD (chronic obstructive pulmonary disease) ([employer]) 2015   Depression 2016   hospitalized in 2019 for 1 week   Diverticulosis 09/2019   GERD (gastroesophageal reflux disease) 2007   Hepatic steatosis 2022   Hx of melanoma of skin    Hypertension    Hypothyroid    IBS (irritable bowel syndrome)    Malignant melanoma ([place])    1986 and 1987   Mixed hyperlipidemia    Osteopenia 2000   Peripheral neuropathy 2015   hands and feet   Rocky Mountain spotted fever 1999   Scalp alopecia    Tubular adenoma 09/2019   Venous insufficiency    Past Surgical History:  Procedure Laterality Date   ABDOMINAL HYSTERECTOMY  1994   ANAL RECTAL MANOMETRY N/A 12/17/2021   Procedure: ANO RECTAL MANOMETRY;  Surgeon: Yetta Flock, MD;  Location: [facility];  Service: Gastroenterology;  Laterality: N/A;   BLEPHAROPLASTY Bilateral 2009   BUNIONECTOMY Right 2018   CESAREAN SECTION  1983   COLONOSCOPY W/ POLYPECTOMY  09/2019   tubular adenoma   TONSILLECTOMY AND ADENOIDECTOMY  1951   Patient Active Problem List   Diagnosis Date Noted   Major depressive disorder, single episode, severe without psychosis ([place]) 02/22/2018   Knee fracture, left 06/21/2009    PCP: Derinda Late, MD (Inactive)  REFERRING PROVIDER: Yetta Flock, MD  REFERRING DIAG: R15.9 (ICD-10-CM) - Incontinence of feces, unspecified fecal incontinence type   THERAPY DIAG:  No diagnosis found.  ONSET DATE: 12/2018  SUBJECTIVE:                                                                                                                                                                                            SUBJECTIVE STATEMENT: Pt reports she has had diarrhea since 2021 and thinks she had COVID in 12/2018 and had trouble with bowels since then. After eating a lot of ice cream one night in 2021 and had diarrhea and sickness the next day and thought she just had an intolerance however has since had consistent diarrhea and then constipation. After several medication changes and different treatments started a medication which really helped however had gotten sick again and started a medication that increased diarrhea  and has been referred to PT. Pt had PT last summer and Dc'd due to lack of progress but has returned. Anal manometry showed decreased strength.  Fluid intake: does not drink enough per pt, usually has 1 large mug of black tea per day, small sips throughout the day and has a 1/2 glass at dinner and 1-2 glasses after dinner  Patient confirms identification and approves PT to assess pelvic floor and treatment Yes  PERTINENT HISTORY:   Sexual abuse: No  PAIN:  Are you having pain? No    BOWEL MOVEMENT Pain with bowel movement: No Type of bowel movement:Type (Bristol Stool Scale) 4-6 , Frequency 3 per day(on average), and Strain Yes sometimes Fully empty rectum: Yes:   Leakage: Yes: usually with walking has leakage with moderate to large loss of stool. Doesn't feel when she leakage unless she feels it on pad Pads: Yes: large pads 5-6 pads per day, 1-2 pads at night Fiber supplement: No  URINATION Pain with urination: No Fully empty bladder: Yes:   Stream: Strong Urgency: Yes: sometimes feels like she urinates more quickly with nerves Frequency: 1-2x per night Leakage:  none Pads: No  INTERCOURSE Pain with intercourse:  post hysterectomy had pain without estrogen, then was unable to have orgasm with medication she was on. Now has no desire due to fecal incontinence Ability to have vaginal penetration:  Yes:       PREGNANCY  C-section deliveries 1 Currently pregnant No  PROLAPSE None  PRECAUTIONS: None  WEIGHT BEARING RESTRICTIONS No  FALLS:  Has patient fallen in last 6 months? No, Number of falls: 0  LIVING ENVIRONMENT: Lives with: lives with their family and lives with their spouse Lives in: House/apartment  Has following equipment at home: None  OCCUPATION: Probation officer  PLOF: Independent  PATIENT GOALS to have less leakage   OBJECTIVE:       COGNITION:  Overall cognitive status: Within functional limits for tasks assessed     SENSATION:  Light touch: Appears intact  Proprioception: Appears intact  MUSCLE LENGTH: Bil Hamstrings and adductors limited by 25%   POSTURE:  Rounded shoulders, posterior pelvic tilt  PALPATION: Internal Pelvic Floor deferred until next session  External Perineal Exam   GENERAL   LUMBARAROM/PROM  Lumbar spine limited by 25% in side bending and rotation all other WFL  LE AROM/PROM:  Bil WFL  LE MMT:  Lt hip grossly 5/5 and Rt hip grossly 3/5  PELVIC MMT: deferred until next session.   MMT  01/18/2022  Vaginal   Internal Anal Sphincter   External Anal Sphincter   Puborectalis   Diastasis Recti   (Blank rows = not tested)   TONE: Assess next session  PROLAPSE: Assess next session     TODAY'S TREATMENT  EVAL educated on exam findings, POC, HEP and abdominal massage and voiding mechanics   PATIENT EDUCATION:  Education details: abdominal massage and voiding mechanics Person educated: Patient Education method: Explanation, Demonstration, Tactile cues, and Verbal cues Education comprehension: verbalized understanding and returned demonstration   HOME EXERCISE PROGRAM: Given at previous Pelvic PT ; [id number]   ASSESSMENT:  CLINICAL IMPRESSION: Patient is a 81 y.o. female who was seen today for physical therapy evaluation and treatment for fecal incontinence. Pt found to have decreased mobility mildly  in spine and hips, mildly decreased Rt hip strength compared to Lt, restrictions in abdomen in all directions, decreased rib mobility with breathing mechanics, and decreased core strength with decreased coordination with breathing mechanics and  tasks. Pt has complex 3 year history of fecal incontinence symptoms which have varied    OBJECTIVE IMPAIRMENTS decreased coordination, decreased endurance, decreased mobility, decreased strength, increased fascial restrictions, impaired flexibility, and postural dysfunction.   ACTIVITY LIMITATIONS community activity.   PERSONAL FACTORS Age, Past/current experiences, and Time since onset of injury/illness/exacerbation are also affecting patient's functional outcome.    REHAB POTENTIAL: Good  CLINICAL DECISION MAKING: Stable/uncomplicated  EVALUATION COMPLEXITY: Low   GOALS: Goals reviewed with patient? Yes  SHORT TERM GOALS:  STG Name Target Date Goal status  1 Pt to be I with HEP.  Baseline:  02/15/2022 INITIAL  2 Pt to report no more than 2 fecal incontinence instances per day  for improved QOL and decreased symptoms.  Baseline: 4-5 02/15/2022 INITIAL  3 Pt to report no more than 3 pads used per day due to incontinence for improved QOL and decreased leakage.  Baseline: 02/15/2022 INITIAL  4  Baseline:    5  Baseline:    6  Baseline:    7  Baseline:     LONG TERM GOALS:   LTG Name Target Date Goal status  1 Pt to be I with advanced HEP.  Baseline: 04/17/22   INITIAL  2 Pt to report no more than 2 fecal incontinence episodes per week for improved QOL and improved skin integrity.  Baseline: 04/17/22 INITIAL  3 Pt to report no more than 1 pad used per day due to incontinence for improved QOL and decreased leakage.  Baseline: 04/17/22 INITIAL  4 Pt to demonstrate at least 4/5 pelvic floor strength and isometric holds for 15s rectally and 8s vaginally for improved ability to withhold stool Baseline: 5 /28/23 INITIAL  5  Baseline:     6  Baseline:    7  Baseline:     PLAN: PT FREQUENCY:  once every other week for 8 visits  PT DURATION:  8 sessions  PLANNED INTERVENTIONS: Therapeutic exercises, Therapeutic activity, Neuro Muscular re-education, Balance training, Gait training, Patient/Family education, Joint mobilization, Aquatic Therapy, Dry Needling, Cryotherapy, Moist heat, Manual lymph drainage, scar mobilization, Taping, and Manual therapy  PLAN FOR NEXT SESSION: internal if pt agreeable to assess pelvic floor formally.    Junie Panning, PT 01/18/2022, 12:26 PM

## 2022-02-02 ENCOUNTER — Other Ambulatory Visit: Payer: Self-pay

## 2022-02-02 ENCOUNTER — Ambulatory Visit: Payer: Medicare PPO | Attending: Gastroenterology | Admitting: Physical Therapy

## 2022-02-02 DIAGNOSIS — R293 Abnormal posture: Secondary | ICD-10-CM | POA: Insufficient documentation

## 2022-02-02 DIAGNOSIS — M6281 Muscle weakness (generalized): Secondary | ICD-10-CM | POA: Insufficient documentation

## 2022-02-02 DIAGNOSIS — R279 Unspecified lack of coordination: Secondary | ICD-10-CM | POA: Diagnosis present

## 2022-02-02 NOTE — Therapy (Signed)
?OUTPATIENT PHYSICAL THERAPY TREATMENT NOTE ? ? ?Patient Name: Yesenia Washington ?MRN: [id number] ?DOB:1940-12-08, 81 y.o., female ?Today's Date: 02/02/2022 ? ?PCP: Derinda Late, MD ?REFERRING PROVIDER: Yetta Flock, MD ? ? PT End of Session - 02/02/22 1236   ? ? Visit Number 2   ? Date for PT Re-Evaluation 04/17/22   ? Authorization Type medicare humana   ? PT Start Time 1232   ? PT Stop Time 1310   ? PT Time Calculation (min) 38 min   ? Activity Tolerance Patient tolerated treatment well   ? Behavior During Therapy [facility] for tasks assessed/performed   ? ?  ?  ? ?  ? ? ?Past Medical History:  ?Diagnosis Date  ? Alcohol abuse   ? Allergy   ? Anxiety 2015  ? Arthritis   ? Carpal tunnel syndrome, bilateral 2014  ? CKD (chronic kidney disease) stage 3, GFR 30-59 ml/min (HCC)   ? COPD (chronic obstructive pulmonary disease) ([place]) 2015  ? Depression 2016  ? hospitalized in 2019 for 1 week  ? Diverticulosis 09/2019  ? GERD (gastroesophageal reflux disease) 2007  ? Hepatic steatosis 2022  ? Hx of melanoma of skin   ? Hypertension   ? Hypothyroid   ? IBS (irritable bowel syndrome)   ? Malignant melanoma ([place])   ? 1986 and 1987  ? Mixed hyperlipidemia   ? Osteopenia 2000  ? Peripheral neuropathy 2015  ? hands and feet  ? [place] spotted fever 1999  ? Scalp alopecia   ? Tubular adenoma 09/2019  ? Venous insufficiency   ? ?Past Surgical History:  ?Procedure Laterality Date  ? ABDOMINAL HYSTERECTOMY  1994  ? ANAL RECTAL MANOMETRY N/A 12/17/2021  ? Procedure: ANO RECTAL MANOMETRY;  Surgeon: Yetta Flock, MD;  Location: [facility];  Service: Gastroenterology;  Laterality: N/A;  ? BLEPHAROPLASTY Bilateral 2009  ? BUNIONECTOMY Right 2018  ? [place]  ? COLONOSCOPY W/ POLYPECTOMY  09/2019  ? tubular adenoma  ? TONSILLECTOMY AND ADENOIDECTOMY  1951  ? ?Patient Active Problem List  ? Diagnosis Date Noted  ? Incontinence of feces with fecal urgency   ? Major depressive disorder, single episode,  severe without psychosis ([place]) 02/22/2018  ? Knee fracture, left 06/21/2009  ? ? ?PCP: Derinda Late, MD (Inactive) ?  ?REFERRING PROVIDER: Yetta Flock, MD ?  ?REFERRING DIAG: R15.9 (ICD-10-CM) - Incontinence of feces, unspecified fecal incontinence type  ?  ?THERAPY DIAG:  ?No diagnosis found. ?  ?ONSET DATE: 12/2018 ?  ?SUBJECTIVE:                                                                                                                                                                                          ?  ?  SUBJECTIVE STATEMENT: ?Pt reports she has been traveling and this has not helped her incontinence, has not been able to try much other than squatty potty while at home but hasn't noted a difference yet.  ?  ?Patient confirms identification and approves PT to assess pelvic floor and treatment Yes ?  ?PERTINENT HISTORY:  ?  ?Sexual abuse: No ?  ?PAIN:  ?Are you having pain? No ?  ?  ?  ?BOWEL MOVEMENT ?Pain with bowel movement: No ?Type of bowel movement:Type (Bristol Stool Scale) 4-6 , Frequency 3 per day(on average), and Strain Yes sometimes ?Fully empty rectum: Yes:   ?Leakage: Yes: usually with walking has leakage with moderate to large loss of stool. Doesn't feel when she leakage unless she feels it on pad ?Pads: Yes: large pads 5-6 pads per day, 1-2 pads at night ?Fiber supplement: No ?  ?URINATION ?Pain with urination: No ?Fully empty bladder: Yes:   ?Stream: Strong ?Urgency: Yes: sometimes feels like she urinates more quickly with nerves ?Frequency: 1-2x per night ?Leakage:  none ?Pads: No ?  ?INTERCOURSE ?Pain with intercourse:  post hysterectomy had pain without estrogen, then was unable to have orgasm with medication she was on. Now has no desire due to fecal incontinence ?Ability to have vaginal penetration:  Yes:   ?  ?  ?  ?PREGNANCY ?  ?C-section deliveries 1 ?Currently pregnant No ?  ?PROLAPSE ?None ?  ?PRECAUTIONS: None ?  ?WEIGHT BEARING RESTRICTIONS No ?  ?FALLS:  ?Has  patient fallen in last 6 months? No, Number of falls: 0 ?  ?LIVING ENVIRONMENT: ?Lives with: lives with their family and lives with their spouse ?Lives in: House/apartment ?  ?Has following equipment at home: None ?  ?OCCUPATION: Probation officer ?  ?PLOF: Independent ?  ?PATIENT GOALS to have less leakage ?  ?  ?OBJECTIVE:  ?  ?  ?  ?  ?  ?COGNITION: ?           Overall cognitive status: Within functional limits for tasks assessed              ?            ?SENSATION: ?           Light touch: Appears intact ?           Proprioception: Appears intact ?  ?MUSCLE LENGTH: ?Bil Hamstrings and adductors limited by 25% ?  ?  ?POSTURE:  ?Rounded shoulders, posterior pelvic tilt ?  ?PALPATION: ?Internal Pelvic Floor rectally assessed, no TTP ?  ?External Perineal Exam small amount of dried stool present at EAS, no redness or skin irritation, no TTP ?  ?GENERAL no TTP at abdomen  ?  ?LUMBARAROM/PROM ?  ?Lumbar spine limited by 25% in side bending and rotation all other WFL ?  ?LE AROM/PROM: ?  ?Bil WFL ?  ?LE MMT: ?  ?Lt hip grossly 5/5 and Rt hip grossly 3/5 ?  ?PELVIC MMT: deferred until next session. ?  ?MMT   ?01/18/2022  ?Vaginal    ?Internal Anal Sphincter  3/5  ?External Anal Sphincter  4/5, 9s holds, 6reps  ?Puborectalis  3/5  ?Diastasis Recti    ?(Blank rows = not tested) ?  ?  ?TONE: ?WFL ?  ?PROLAPSE: ?None assessed rectally ? ?  ?  ?  ?  ?TODAY'S TREATMENT  ?EVAL educated on exam findings, POC, HEP and abdominal massage and voiding mechanics ? ?02/02/2022 Pt consented to internal rectal assessment, reports she attempted  to have BM earlier today but unable to finish. Pt has had diet changes this week due to traveling and has made her slightly constipated per pt. Pt denied all pain, found to have weakness at pelvic floor, decreased endurance and decreased coordination. Pt also demonstrated great difficulty with relaxing from contraction and unable to bulge, demonstrated contraction with all attempts. Pt benefited from max cues  and breathing mechanics for improved bulge technique once. Pt tolerated well, no pain and educated on findings, HEP updated and pt directed in 10 pelvic contractions, x5 isometric holds for at most 9s, and Y56 quick flicks.  ?  ?  ?PATIENT EDUCATION:  ?Education details: abdominal massage and voiding mechanics ?Person educated: Patient ?Education method: Explanation, Demonstration, Tactile cues, and Verbal cues ?Education comprehension: verbalized understanding and returned demonstration ?  ?  ?HOME EXERCISE PROGRAM: ?Given at previous Pelvic PT ; [id number]  ?  ?ASSESSMENT: ?  ?CLINICAL IMPRESSION: ?Patient is a 81 y.o. female who was seen today for physical therapy evaluation and treatment for fecal incontinence. Pt found to have decreased mobility mildly in spine and hips, mildly decreased Rt hip strength compared to Lt, restrictions in abdomen in all directions, decreased rib mobility with breathing mechanics, and decreased core strength with decreased coordination with breathing mechanics and tasks. Pt has complex 3 year history of fecal incontinence symptoms which have varied  ?  ?  ?OBJECTIVE IMPAIRMENTS decreased coordination, decreased endurance, decreased mobility, decreased strength, increased fascial restrictions, impaired flexibility, and postural dysfunction.  ?  ?ACTIVITY LIMITATIONS community activity.  ?  ?PERSONAL FACTORS Age, Past/current experiences, and Time since onset of injury/illness/exacerbation are also affecting patient's functional outcome.  ?  ?  ?REHAB POTENTIAL: Good ?  ?CLINICAL DECISION MAKING: Stable/uncomplicated ?  ?EVALUATION COMPLEXITY: Low ?  ?  ?GOALS: ?Goals reviewed with patient? Yes ?  ?SHORT TERM GOALS: ?  ?STG Name Target Date Goal status  ?1 Pt to be I with HEP. ?  ?Baseline:  02/15/2022 INITIAL  ?2 Pt to report no more than 2 fecal incontinence instances per day  for improved QOL and decreased symptoms.  ?Baseline: 4-5 02/15/2022 INITIAL  ?3 Pt to report no more than 3  pads used per day due to incontinence for improved QOL and decreased leakage.  ?Baseline: 02/15/2022 INITIAL  ?4   ?Baseline:      ?5   ?Baseline:      ?6   ?Baseline:      ?7   ?Baseline:      ?  ?LONG TERM GOALS

## 2022-02-15 ENCOUNTER — Ambulatory Visit: Payer: Medicare PPO | Admitting: Physical Therapy

## 2022-02-15 ENCOUNTER — Other Ambulatory Visit: Payer: Self-pay

## 2022-02-15 DIAGNOSIS — M6281 Muscle weakness (generalized): Secondary | ICD-10-CM

## 2022-02-15 DIAGNOSIS — R279 Unspecified lack of coordination: Secondary | ICD-10-CM

## 2022-02-15 DIAGNOSIS — R293 Abnormal posture: Secondary | ICD-10-CM

## 2022-02-15 NOTE — Therapy (Signed)
?OUTPATIENT PHYSICAL THERAPY TREATMENT NOTE ? ? ?Patient Name: Yesenia Washington ?MRN: [id number] ?DOB:[birth year], 81 y.o., female ?Today's Date: 02/15/2022 ? ?PCP: Derinda Late, MD ?REFERRING PROVIDER: Yetta Flock, MD ? ? PT End of Session - 02/15/22 1535   ? ? Visit Number 3   ? Date for PT Re-Evaluation 04/17/22   ? Authorization Type medicare humana   ? PT Start Time 7846   ? PT Stop Time 9629   ? PT Time Calculation (min) 38 min   ? Activity Tolerance Patient tolerated treatment well   ? Behavior During Therapy [facility] for tasks assessed/performed   ? ?  ?  ? ?  ? ? ?Past Medical History:  ?Diagnosis Date  ? Alcohol abuse   ? Allergy   ? Anxiety 2015  ? Arthritis   ? Carpal tunnel syndrome, bilateral 2014  ? CKD (chronic kidney disease) stage 3, GFR 30-59 ml/min (HCC)   ? COPD (chronic obstructive pulmonary disease) ([place]) 2015  ? Depression 2016  ? hospitalized in 2019 for 1 week  ? Diverticulosis 09/2019  ? GERD (gastroesophageal reflux disease) 2007  ? Hepatic steatosis 2022  ? Hx of melanoma of skin   ? Hypertension   ? Hypothyroid   ? IBS (irritable bowel syndrome)   ? Malignant melanoma ([place])   ? 1986 and 1987  ? Mixed hyperlipidemia   ? Osteopenia 2000  ? Peripheral neuropathy 2015  ? hands and feet  ? [place] spotted fever 1999  ? Scalp alopecia   ? Tubular adenoma 09/2019  ? Venous insufficiency   ? ?Past Surgical History:  ?Procedure Laterality Date  ? ABDOMINAL HYSTERECTOMY  1994  ? ANAL RECTAL MANOMETRY N/A 12/17/2021  ? Procedure: ANO RECTAL MANOMETRY;  Surgeon: Yetta Flock, MD;  Location: [facility];  Service: Gastroenterology;  Laterality: N/A;  ? BLEPHAROPLASTY Bilateral 2009  ? BUNIONECTOMY Right 2018  ? [place]  ? COLONOSCOPY W/ POLYPECTOMY  09/2019  ? tubular adenoma  ? TONSILLECTOMY AND ADENOIDECTOMY  1951  ? ?Patient Active Problem List  ? Diagnosis Date Noted  ? Incontinence of feces with fecal urgency   ? Major depressive disorder, single episode,  severe without psychosis ([place]) 02/22/2018  ? Knee fracture, left 06/21/2009  ? ? ?PCP: Derinda Late, MD (Inactive) ?  ?REFERRING PROVIDER: Yetta Flock, MD ?  ?REFERRING DIAG: R15.9 (ICD-10-CM) - Incontinence of feces, unspecified fecal incontinence type  ?  ?THERAPY DIAG:  ?No diagnosis found. ?  ?ONSET DATE: 12/2018 ?  ?SUBJECTIVE:                                                                                                                                                                                          ?  ?  SUBJECTIVE STATEMENT: ?Pt reports she has been trying to figure out triggers for her fecal incontinence and found that chocolate leads to fecal incontinence for her. Pt reports she cut out chocolate which she eats daily in small amounts, and then had no leakage at all. Pt reports she did have 2 days of no Bms and decreased taking imodium to half and had a BM, tried a chocolate and still not having leakage.  ? ?Patient confirms identification and approves PT to assess pelvic floor and treatment Yes ?  ?PERTINENT HISTORY:  ?  ?Sexual abuse: No ?  ?PAIN:  ?Are you having pain? No ?  ?  ?  ?OBJECTIVE:  ?  ?  ?  ?COGNITION: ?           Overall cognitive status: Within functional limits for tasks assessed              ?            ?SENSATION: ?           Light touch: Appears intact ?           Proprioception: Appears intact ?  ?MUSCLE LENGTH: ?Bil Hamstrings and adductors limited by 25% ?  ?  ?POSTURE:  ?Rounded shoulders, posterior pelvic tilt ?  ?PALPATION: ?Internal Pelvic Floor rectally assessed, no TTP ?  ?External Perineal Exam small amount of dried stool present at EAS, no redness or skin irritation, no TTP ?  ?GENERAL no TTP at abdomen  ?  ?LUMBARAROM/PROM ?  ?Lumbar spine limited by 25% in side bending and rotation all other WFL ?  ?LE AROM/PROM: ?  ?Bil WFL ?  ?LE MMT: ?  ?Lt hip grossly 5/5 and Rt hip grossly 3/5 ?  ?PELVIC MMT: deferred until next session. ?  ?MMT   ?01/18/2022   ?Vaginal    ?Internal Anal Sphincter  3/5  ?External Anal Sphincter  4/5, 9s holds, 6reps  ?Puborectalis  3/5  ?Diastasis Recti    ?(Blank rows = not tested) ?  ?  ?TONE: ?WFL ?  ?PROLAPSE: ?None assessed rectally ? ?  ?  ?  ?  ?TODAY'S TREATMENT  ? ?02/15/2022:  ?Pt educated on quad modified positions with standing with hands at counter to relief knees from quad ?Forward bent/modified quad exercises discussed/reviewed with pt and updated HEP to target posterior pelvic floor strengthening as pt reports she has difficulty feeling if she is tightening at rectum but can feel at vagina. In these positions pt reports she could feel contraction better ?2x10 pelvic floor contraction in sitting, x10 with forward hip hinge/modified quad at counter pelvic contractions, x5 isometric holds for 5-8s, and Y1P quick flicks ?Pt also educated on voiding and breathing/balloon breathing techniques to decrease straining at pelvic floor for bowel movements as needed.  ?______________________________ ? ? ? ?EVAL educated on exam findings, POC, HEP and abdominal massage and voiding mechanics ? ?02/15/2022 Pt consented to internal rectal assessment, reports she attempted to have BM earlier today but unable to finish. Pt has had diet changes this week due to traveling and has made her slightly constipated per pt. Pt denied all pain, found to have weakness at pelvic floor, decreased endurance and decreased coordination. Pt also demonstrated great difficulty with relaxing from contraction and unable to bulge, demonstrated contraction with all attempts. Pt benefited from max cues and breathing mechanics for improved bulge technique once. Pt tolerated well, no pain and educated on findings, HEP updated and pt directed in 10  pelvic contractions, x5 isometric holds for at most 9s, and N39 quick flicks.  ?  ?  ?PATIENT EDUCATION:  ?Education details: updated HEP and voiding mechanics ?Person educated: Patient ?Education method: Explanation,  Demonstration, Tactile cues, and Verbal cues ?Education comprehension: verbalized understanding and returned demonstration ?  ?  ?HOME EXERCISE PROGRAM: ?Given at previous Pelvic PT; [id number]  ?  ?ASSESSMENT: ?  ?CLINICAL IMPRESSION: ?Pt presents to clinic reporting no leakage in at least one week since omitting chocolate and cutting back one sweets and daily Bms with one half imodium. Pt session focused on position modifications of HEP for decreased pain/pressure at pt's knees. Pt attempted HEP updated and denied additional questions reporting modified quad was helpful and with forward hip hinge pt able to feel pelvic contractions at posterior pelvic floor better than in sitting. Pt tolerated well and hopeful she continues to have decreased leakage. Pt would benefit from additional PT to further address deficits.   ?  ?  ?OBJECTIVE IMPAIRMENTS decreased coordination, decreased endurance, decreased mobility, decreased strength, increased fascial restrictions, impaired flexibility, and postural dysfunction.  ?  ?ACTIVITY LIMITATIONS community activity.  ?  ?PERSONAL FACTORS Age, Past/current experiences, and Time since onset of injury/illness/exacerbation are also affecting patient's functional outcome.  ?  ?  ?REHAB POTENTIAL: Good ?  ?CLINICAL DECISION MAKING: Stable/uncomplicated ?  ?EVALUATION COMPLEXITY: Low ?  ?  ?GOALS: ?Goals reviewed with patient? Yes ?  ?SHORT TERM GOALS: ?  ?STG Name Target Date Goal status  ?1 Pt to be I with HEP. ?  ?Baseline:  02/15/2022 INITIAL  ?2 Pt to report no more than 2 fecal incontinence instances per day  for improved QOL and decreased symptoms.  ?Baseline: 4-5 02/15/2022 INITIAL  ?3 Pt to report no more than 3 pads used per day due to incontinence for improved QOL and decreased leakage.  ?Baseline: 02/15/2022 INITIAL  ?4   ?Baseline:      ?5   ?Baseline:      ?6   ?Baseline:      ?7   ?Baseline:      ?  ?LONG TERM GOALS:  ?  ?LTG Name Target Date Goal status  ?1 Pt to be I with  advanced HEP. ?  ?Baseline: 04/17/22 ?  INITIAL  ?2 Pt to report no more than 2 fecal incontinence episodes per week for improved QOL and improved skin integrity.  ?Baseline: 04/17/22 INITIAL  ?3 Pt to report no more than 1

## 2022-02-28 ENCOUNTER — Ambulatory Visit: Payer: Medicare PPO | Attending: Gastroenterology | Admitting: Physical Therapy

## 2022-02-28 DIAGNOSIS — R279 Unspecified lack of coordination: Secondary | ICD-10-CM | POA: Insufficient documentation

## 2022-02-28 DIAGNOSIS — M6281 Muscle weakness (generalized): Secondary | ICD-10-CM | POA: Insufficient documentation

## 2022-02-28 NOTE — Therapy (Signed)
?OUTPATIENT PHYSICAL THERAPY TREATMENT NOTE ? ? ?Patient Name: Yesenia Washington ?MRN: [id number] ?DOB:Feb 11, 1941, 81 y.o., female ?Today's Date: 02/28/2022 ? ?PCP: Derinda Late, MD ?REFERRING PROVIDER: Yetta Flock, MD ? ? PT End of Session - 02/28/22 1304   ? ? Visit Number 4   ? Date for PT Re-Evaluation 04/17/22   ? Authorization Type medicare humana   ? PT Start Time 1230   ? PT Stop Time 6295   ? PT Time Calculation (min) 36 min   ? Activity Tolerance Patient tolerated treatment well   ? Behavior During Therapy [facility] for tasks assessed/performed   ? ?  ?  ? ?  ? ? ? ?Past Medical History:  ?Diagnosis Date  ? Alcohol abuse   ? Allergy   ? Anxiety 2015  ? Arthritis   ? Carpal tunnel syndrome, bilateral 2014  ? CKD (chronic kidney disease) stage 3, GFR 30-59 ml/min (HCC)   ? COPD (chronic obstructive pulmonary disease) ([place]) 2015  ? Depression 2016  ? hospitalized in 2019 for 1 week  ? Diverticulosis 09/2019  ? GERD (gastroesophageal reflux disease) 2007  ? Hepatic steatosis 2022  ? Hx of melanoma of skin   ? Hypertension   ? Hypothyroid   ? IBS (irritable bowel syndrome)   ? Malignant melanoma ([place])   ? 1986 and 1987  ? Mixed hyperlipidemia   ? Osteopenia 2000  ? Peripheral neuropathy 2015  ? hands and feet  ? [place] spotted fever 1999  ? Scalp alopecia   ? Tubular adenoma 09/2019  ? Venous insufficiency   ? ?Past Surgical History:  ?Procedure Laterality Date  ? ABDOMINAL HYSTERECTOMY  1994  ? ANAL RECTAL MANOMETRY N/A 12/17/2021  ? Procedure: ANO RECTAL MANOMETRY;  Surgeon: Yetta Flock, MD;  Location: [facility];  Service: Gastroenterology;  Laterality: N/A;  ? BLEPHAROPLASTY Bilateral 2009  ? BUNIONECTOMY Right 2018  ? [place]  ? COLONOSCOPY W/ POLYPECTOMY  09/2019  ? tubular adenoma  ? TONSILLECTOMY AND ADENOIDECTOMY  1951  ? ?Patient Active Problem List  ? Diagnosis Date Noted  ? Incontinence of feces with fecal urgency   ? Major depressive disorder, single episode,  severe without psychosis ([place]) 02/22/2018  ? Knee fracture, left 06/21/2009  ? ? ?PCP: Derinda Late, MD (Inactive) ?  ?REFERRING PROVIDER: Yetta Flock, MD ?  ?REFERRING DIAG: R15.9 (ICD-10-CM) - Incontinence of feces, unspecified fecal incontinence type  ?  ?THERAPY DIAG:  ?No diagnosis found. ?  ?ONSET DATE: 12/2018 ?  ?SUBJECTIVE:                                                                                                                                                                                          ?  ?  SUBJECTIVE STATEMENT: ?Pt reports she has eliminated chocolate from diet and has not had any leakage of stool at all. Pt very pleased by this and reports she understands HEP and would like to discharge from PT after this session.   ? ?Patient confirms identification and approves PT to assess pelvic floor and treatment Yes ?  ?PERTINENT HISTORY:  ?  ?Sexual abuse: No ?  ?PAIN:  ?Are you having pain? No ?  ?  ?  ?OBJECTIVE:  ?  ?  ?  ?COGNITION: ?           Overall cognitive status: Within functional limits for tasks assessed              ?            ?SENSATION: ?           Light touch: Appears intact ?           Proprioception: Appears intact ?  ?MUSCLE LENGTH: ?Bil Hamstrings and adductors limited by 25% ?  ?  ?POSTURE:  ?Rounded shoulders, posterior pelvic tilt ?  ?PALPATION: ?Internal Pelvic Floor rectally assessed, no TTP ?  ?External Perineal Exam small amount of dried stool present at EAS, no redness or skin irritation, no TTP ?  ?GENERAL no TTP at abdomen  ?  ?LUMBARAROM/PROM ?  ?Lumbar spine limited by 25% in side bending and rotation all other WFL ?  ?LE AROM/PROM: ?  ?Bil WFL ?  ?LE MMT: ?  ?Lt hip grossly 5/5 and Rt hip grossly 3/5 ?  ?PELVIC MMT: deferred until next session. ?  ?MMT   ?02/02/22 02/28/22  ?Vaginal     ?Internal Anal Sphincter  3/5 4/5  ?External Anal Sphincter  4/5, 9s holds, 6reps 4/5, 25s holds, 6 reps  ?Puborectalis  3/5 4/5  ?Diastasis Recti     ?(Blank rows =  not tested) ?  ?  ?TONE: ?WFL ?  ?PROLAPSE: ?None assessed rectally ? ?  ?  ?  ?  ?TODAY'S TREATMENT  ? ?02/28/2022:  ? Pt educated on continued voiding and breathing mechanics ?Pt consented to internal rectal treatment this date to make sure she is doing pelvic floor mobility correctly. Pt's findings in objectives. All sections improved from initially assessment ?X10 pelvic contractions rectally, 0H21 quick flicks, x6 isometric holds for 20-30s ? ?02/15/22:  ?Pt educated on quad modified positions with standing with hands at counter to relief knees from quad ?Forward bent/modified quad exercises discussed/reviewed with pt and updated HEP to target posterior pelvic floor strengthening as pt reports she has difficulty feeling if she is tightening at rectum but can feel at vagina. In these positions pt reports she could feel contraction better ?2x10 pelvic floor contraction in sitting, x10 with forward hip hinge/modified quad at counter pelvic contractions, x5 isometric holds for 5-8s, and Y2Q quick flicks ?Pt also educated on voiding and breathing/balloon breathing techniques to decrease straining at pelvic floor for bowel movements as needed.  ?______________________________ ? ? ? ?01/18/22 EVAL educated on exam findings, POC, HEP and abdominal massage and voiding mechanics ? ?02/02/22: Pt consented to internal rectal assessment, reports she attempted to have BM earlier today but unable to finish. Pt has had diet changes this week due to traveling and has made her slightly constipated per pt. Pt denied all pain, found to have weakness at pelvic floor, decreased endurance and decreased coordination. Pt also demonstrated great difficulty with relaxing from contraction and unable to bulge, demonstrated contraction with all  attempts. Pt benefited from max cues and breathing mechanics for improved bulge technique once. Pt tolerated well, no pain and educated on findings, HEP updated and pt directed in 10 pelvic contractions,  x5 isometric holds for at most 9s, and X50 quick flicks.  ?  ?  ?PATIENT EDUCATION:  ?Education details: updated HEP and voiding mechanics ?Person educated: Patient ?Education method: Explanation, Demonstration, Tactile cues, and Verbal cues ?Education comprehension: verbalized understanding and returned demonstration ?  ?  ?HOME EXERCISE PROGRAM: ?Given at previous Pelvic PT; [id number]  ?  ?ASSESSMENT: ?  ?CLINICAL IMPRESSION: ?Pt presents to clinic reporting no leakage at all since removal of chocolate from diet. Pt reports she feels better and would like to be discharged from PT. Pt has met all goals and pleased with progress, pt reports she feels more confident going out in community as she is not have leakage and now only wearing one light panty liner daily and this is not soiled. Pt understands she will need a new referral for future PT needs.  ?  ?  ?OBJECTIVE IMPAIRMENTS decreased coordination, decreased endurance, decreased mobility, decreased strength, increased fascial restrictions, impaired flexibility, and postural dysfunction.  ?  ?ACTIVITY LIMITATIONS community activity.  ?  ?PERSONAL FACTORS Age, Past/current experiences, and Time since onset of injury/illness/exacerbation are also affecting patient's functional outcome.  ?  ?  ?REHAB POTENTIAL: Good ?  ?CLINICAL DECISION MAKING: Stable/uncomplicated ?  ?EVALUATION COMPLEXITY: Low ?  ?  ?GOALS: ?Goals reviewed with patient? Yes ?  ?SHORT TERM GOALS: ?  ?STG Name Target Date Goal status   ?1 Pt to be I with HEP. ?  ?Baseline:  02/15/2022 INITIAL MET  ?2 Pt to report no more than 2 fecal incontinence instances per day  for improved QOL and decreased symptoms.  ?Baseline: 4-5 02/15/2022 INITIAL MET  ?3 Pt to report no more than 3 pads used per day due to incontinence for improved QOL and decreased leakage.  ?Baseline: 02/15/2022 INITIAL MET  ?4   ?Baseline:       ?5   ?Baseline:       ?6   ?Baseline:       ?7   ?Baseline:       ?  ?LONG TERM GOALS:  ?  ?LTG  Name Target Date Goal status   ?1 Pt to be I with advanced HEP. ?  ?Baseline: 04/17/22 ?  INITIAL MET  ?2 Pt to report no more than 2 fecal incontinence episodes per week for improved QOL and improved sk

## 2022-04-26 ENCOUNTER — Encounter: Payer: Self-pay | Admitting: Gastroenterology

## 2022-04-27 ENCOUNTER — Other Ambulatory Visit: Payer: Self-pay | Admitting: Gastroenterology

## 2022-06-14 ENCOUNTER — Ambulatory Visit: Payer: Medicare PPO | Admitting: Gastroenterology

## 2022-06-14 ENCOUNTER — Encounter: Payer: Self-pay | Admitting: Gastroenterology

## 2022-06-14 VITALS — BP 100/70 | HR 92 | Ht 66.0 in | Wt 137.0 lb

## 2022-06-14 DIAGNOSIS — R159 Full incontinence of feces: Secondary | ICD-10-CM | POA: Diagnosis not present

## 2022-06-14 DIAGNOSIS — K529 Noninfective gastroenteritis and colitis, unspecified: Secondary | ICD-10-CM

## 2022-06-14 MED ORDER — ZENPEP 40000-126000 UNITS PO CPEP: ORAL_CAPSULE | ORAL | 3 refills | Status: DC

## 2022-06-14 MED ORDER — LOPERAMIDE HCL 2 MG PO TABS: 1.0000 mg | ORAL_TABLET | ORAL | Status: DC | PRN

## 2022-06-14 MED ORDER — RIFAXIMIN 550 MG PO TABS: 550.0000 mg | ORAL_TABLET | Freq: Three times a day (TID) | ORAL | 0 refills | Status: AC

## 2022-06-14 NOTE — Patient Instructions (Addendum)
If you are age 79 or older, your body mass index should be between 23-30. Your Body mass index is 22.11 kg/m. If this is out of the aforementioned range listed, please consider follow up with your Primary Care Provider.  If you are age 15 or younger, your body mass index should be between 19-25. Your Body mass index is 22.11 kg/m. If this is out of the aformentioned range listed, please consider follow up with your Primary Care Provider.   ________________________________________________________  We have sent the following medications to your pharmacy for you to pick up at your convenience: Zenpep: Take 2 to 3 tablets with meals  Xifaxan: Take three times a day for 2 weeks.  We will send this to [facility]. They will reach out to you with more information.  Increase your Imodium as needed  Discontinue probiotic.  We have scheduled you for a follow up appointment on Thursday, 9-14 at 9:50 am.  Thank you for entrusting me with your care and for choosing [facility], Dr. Florissant Cellar

## 2022-06-14 NOTE — Progress Notes (Signed)
HPI :  81 year old female here for follow-up visit for chronic diarrhea and fecal incontinence.  See below for extensive work-up.  She has had an evaluation to include colonoscopy, anorectal manometry, serology, stool studies.  She is thought to have weak pelvic floor in the setting of loose stools causing her incontinence.We screened her for celiac disease with serology which was negative.  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  Fecal pancreatic elastase which was low at 112. She was started on Zenpep. Imaging of her pancreas was normal.   Over time we have stopped her oral magnesium supplement, she failed colestid / cholestyramine, immodium monotherapy. Low FODMAP diet has helped.  She has most recently been treated with pancreatic enzymes (Zenpep), immodium, low FODMAP diet, and a course of rifaximin, in addition to being treated by pelvic floor PT. We had discussed a referral to colorectal surgery for possible interstim evaluation, she had not had that done.  I last saw her in November, she had anorectal manometry in January.  She was not able to start her rifaximin until January.  She states the rifaximin was "like a miracle".  She states she felt so much better on the regimen and she did not have any incontinence for months after that.  She was not needing to use much of any Imodium and felt she was in a good spot.  Unfortunately she developed some paronychia which led to taking antibiotic, Keflex.  Shortly after starting the antibiotic she states her loose stools recurred.  Since that time she has struggled with her stools although its not nearly as bad as it was before.  She does not think Zenpep has helped her, she is taking only 1 tablet with meals however.  She is taking about 1 Imodium per day.  On a good day she can have 2-3 bowel movements in a day, on a bad day she can have much worse.  She can have fecal incontinence really but this is much better  controlled than it was before.  She denies any overt stearrhea.  No weight loss.  She has completed a course of pelvic floor PT and they dismissed her as they thought her pelvic floor muscles were much stronger.  She is taking align on a daily basis more recently.     Prior workup has included.  TSH normal ESR normal CBC normal, no anemia or leukocytosis LFTs normal   Abdominal x ray - 03/11/20 - large stool burden   CT abdomen / pelvis 01/21/20 - moderate stool burden in right and transverse colon suggestive of constipation, liver is NORMAL   Colonoscopy 09/27/19 - one diminutive adenoma, random biopsies showed no evidence of microscopic colitis, normal ileum, low sphincter tone   US abdomen 01/17/20: IMPRESSION: Large nonspecific area of increased hepatic parenchymal echogenicity within the right lobe of the liver which may represent focal fatty deposition. Given the overall appearance however confirmation with pre and post contrast-enhanced MRI is recommended.   Hepatic steatosis.   TTG IgA negative Total IgA 129 Pancreatic fecal elastase 112 HLA DQ2 (+), HLA DQ8 negative   MRI abdomen / pelvis 03/01/21: IMPRESSION: 1. No acute findings or explanation for the patient's symptoms. 2. Sigmoid diverticulosis without evidence of diverticulitis. 3. Mild hepatic steatosis   Anorectal manometry 12/17/21: Weak anal sphincter Rectal hyposensitivity Evidence of dyssynergia    Past Medical History:  Diagnosis Date   Alcohol abuse    Allergy  Anxiety 2015   Arthritis    Carpal tunnel syndrome, bilateral 2014   CKD (chronic kidney disease) stage 3, GFR 30-59 ml/min (HCC)    COPD (chronic obstructive pulmonary disease) ([place]) 2015   Depression 2016   hospitalized in 2019 for 1 week   Diverticulosis 09/2019   GERD (gastroesophageal reflux disease) 2007   Hepatic steatosis 2022   Hx of melanoma of skin    Hypertension    Hypothyroid    IBS (irritable bowel syndrome)     Malignant melanoma ([place])    1986 and 1987   Mixed hyperlipidemia    Osteopenia 2000   Peripheral neuropathy 2015   hands and feet   [place] spotted fever 1999   Scalp alopecia    Tubular adenoma 09/2019   Venous insufficiency      Past Surgical History:  Procedure Laterality Date   ABDOMINAL HYSTERECTOMY  1994   ANAL RECTAL MANOMETRY N/A 12/17/2021   Procedure: ANO RECTAL MANOMETRY;  Surgeon: Yetta Flock, MD;  Location: [facility];  Service: Gastroenterology;  Laterality: N/A;   BLEPHAROPLASTY Bilateral 2009   BUNIONECTOMY Right 2018   CESAREAN SECTION  1983   COLONOSCOPY W/ POLYPECTOMY  09/2019   tubular adenoma   TONSILLECTOMY AND ADENOIDECTOMY  1951   Family History  Problem Relation Age of Onset   Atrial fibrillation Mother    Arthritis Mother    Dementia Mother    Prostate cancer Father 39   CAD Father 36   Atrial fibrillation Maternal Aunt    Heart disease Paternal Aunt    Heart disease Paternal Grandmother    Colon polyps Brother    Colon cancer Neg Hx    Pancreatic cancer Neg Hx    Esophageal cancer Neg Hx    Social History   Tobacco Use   Smoking status: Former   Smokeless tobacco: Never  Substance Use Topics   Alcohol use: Yes   Drug use: Never   Current Outpatient Medications  Medication Sig Dispense Refill   Alpha-D-Galactosidase (BEANO) TABS Take 1 tablet by mouth daily.     ALPRAZolam (XANAX) 1 MG tablet Take 1 mg by mouth at bedtime.     Bacillus Coagulans-Inulin (ALIGN PREBIOTIC-PROBIOTIC PO) Take 1 tablet by mouth daily.     BIOTIN 5000 PO Take 1 tablet by mouth daily.     Calcium Carbonate (CALCIUM 500 PO) Take 1 Capful by mouth daily.     cetirizine (ZYRTEC) 10 MG tablet Take 10 mg by mouth daily.     cholecalciferol (VITAMIN D) 1000 units tablet Take 1,000 Units by mouth daily.     Coenzyme Q10 (COQ10) 200 MG CAPS Take 1 tablet by mouth daily.     levothyroxine (SYNTHROID, LEVOTHROID) 125 MCG tablet Take 125 mcg by mouth  daily.     loperamide (IMODIUM A-D) 2 MG tablet Take 0.5 tablets (1 mg total) by mouth in the morning. Titrate as needed (Patient taking differently: Take 1 mg by mouth as needed. Titrate as needed) 30 tablet 0   losartan (COZAAR) 50 MG tablet Take 50 mg by mouth daily.     rosuvastatin (CRESTOR) 5 MG tablet Take 5 mg by mouth every other day.     Simethicone (GAS-X PO) Take 1 tablet by mouth daily.     vitamin B-12 (CYANOCOBALAMIN) 1000 MCG tablet Take 1,000 mcg by mouth daily.     Pancrelipase, Lip-Prot-Amyl, (ZENPEP) 40000-126000 units CPEP Take 1 capsule by mouth three times daily with each meal  AND take 1 capsule twice daily with each snack if needed. PLEASE KEEP YOUR JULY APPOINTMENT FOR FURTHER REFILLS. Thank you 150 capsule 0   No current facility-administered medications for this visit.   Allergies  Allergen Reactions   Darvon     Hallucinations   Oxycodone Hcl     Sedation    Pravastatin Other (See Comments)    myalgias   Simvastatin Other (See Comments)    Change in bowel habits     Review of Systems: All systems reviewed and negative except where noted in HPI.   Lab Results  Component Value Date   WBC 7.7 02/21/2018   HGB 13.8 02/21/2018   HCT 42.5 02/21/2018   MCV 103.9 (H) 02/21/2018   PLT 241 02/21/2018    Lab Results  Component Value Date   CREATININE 0.81 02/21/2018   BUN 17 02/21/2018   NA 141 02/21/2018   K 3.5 02/21/2018   CL 107 02/21/2018   CO2 23 02/21/2018    No results found for: "ALT", "AST", "GGT", "ALKPHOS", "BILITOT"   Physical Exam: BP 100/70   Pulse 92   Ht '5\' 6"'  (1.676 m)   Wt 137 lb (62.1 kg)   BMI 22.11 kg/m  Constitutional: Pleasant,well-developed, female in no acute distress. Neurological: Alert and oriented to person place and time. Psychiatric: Normal mood and affect. Behavior is normal.   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  She has responded to the regimen that she has been on and is generally doing better although regressed a bit after an antibiotic course a few months ago.  She felt that rifaximin has helped her more than anything up to this point, we will give her another course of rifaximin 550 mg 3 times daily for 2 weeks.  I think she is underdosed for her Zenpep, will increase to 2-3 times per meal and see if that helps.  She can increase her Imodium use as needed as well, and continue low FODMAP diet.  Recommend she stop probiotics at this time, especially while starting rifaximin.  She will follow-up with me in 6 to 8 weeks or so.  If her symptoms persist despite this, specifically incontinence, can consider surgical evaluation for InterStim although want to avoid this if possible, she had improved with prior course of pelvic floor PT.  Plan: - Rifaximin 525m TID for 2 weeks - increase Zenpep to 2-3 tabs / meal  - increase immodium PRN - continue low FODMAP diet if she finds it beneficial - stop probiotics - f/u 6-8 weeks - consider colorectal surgical evaluation if incontinence persists  SJolly Mango MD [facility]

## 2022-06-15 ENCOUNTER — Telehealth: Payer: Self-pay

## 2022-06-15 NOTE — Telephone Encounter (Signed)
Rec'd fax from [employer] indicating they will reach out to the patient for counseling and scheduling shipment.  Current copay $100.

## 2022-06-15 NOTE — Telephone Encounter (Signed)
Insurance card and Office note from 06-14-22 faxed to [place] for [employer] [phone])

## 2022-06-17 NOTE — Telephone Encounter (Signed)
Notification from [facility] [employer] [facility] has shipped to patient on 7-26. Should arrive 7-28.

## 2022-08-04 ENCOUNTER — Encounter: Payer: Self-pay | Admitting: Gastroenterology

## 2022-08-04 ENCOUNTER — Ambulatory Visit: Payer: Medicare PPO | Admitting: Gastroenterology

## 2022-08-04 VITALS — BP 110/60 | HR 80 | Ht 65.0 in | Wt 137.0 lb

## 2022-08-04 DIAGNOSIS — K529 Noninfective gastroenteritis and colitis, unspecified: Secondary | ICD-10-CM

## 2022-08-04 DIAGNOSIS — R159 Full incontinence of feces: Secondary | ICD-10-CM | POA: Diagnosis not present

## 2022-08-04 NOTE — Progress Notes (Signed)
HPI :  81 year old female here for follow-up visit for chronic diarrhea and fecal incontinence. I saw her most recently in July.   See below for extensive work-up, including colonoscopy, anorectal manometry, serology, stool studies.  She is thought to have weak pelvic floor in the setting of loose stools causing her incontinence. We screened her for celiac disease with serology which was negative.  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  Fecal pancreatic elastase which was low at 112. She was started on Zenpep. Imaging of her pancreas was normal.    Over time we stopped her oral magnesium supplement, she failed colestid / cholestyramine, immodium monotherapy. Low FODMAP diet has helped.  She has most recently been treated with higher dosing of pancreatic enzymes (Zenpep), immodium, low FODMAP diet, and a course of rifaximin, in addition to being treated by pelvic floor PT. We had discussed a referral to colorectal surgery for possible interstim evaluation, she had not had that done yet.   She previously had a very good response to rifaximin so after our last visit we gave her another 2-week course of it.  She states while it took some time to "kick in" it definitely made a difference and she was feeling much better on it.  She had also stopped probiotics.  While her bowel habits were much better she was still having some occasional leakage of stool, especially while walking.  She states she has continued to do pelvic floor PT, she has seen them on 2 separate consultations, she was discharged early from her last visit for "improvement".  Unfortunately the patient had a setback on August 22, states she ate some macaroni and cheese and shortly thereafter had severe nausea and vomiting with diarrhea that lasted for several hours and then resolved.  Since then her bowels have taken some time to get back to normal.  She [place] folk [place]  and she states she had significant fecal incontinence that is passive and she can't control it.  She has been using Imodium more frequently to bulk up her stool but this actually caused some constipation.  She typically takes 1 Imodium per day, increase to 1-1/2 tabs a day and she thinks it is a bit too strong, if she only takes half pill a day her stool remains on the looser side, she is very sensitive to this.  She has been taking 2 Zenpep's with each meal and she does think there is a component of improvement with the Zenpep but she does not see any stearrhea or problems like that.  The passive incontinence and leakage is her main concern.  We discussed potential surgical options.     Prior workup has included.  TSH normal ESR normal CBC normal, no anemia or leukocytosis LFTs normal   Abdominal x ray - 03/11/20 - large stool burden   CT abdomen / pelvis 01/21/20 - moderate stool burden in right and transverse colon suggestive of constipation, liver is NORMAL   Colonoscopy 09/27/19 - one diminutive adenoma, random biopsies showed no evidence of microscopic colitis, normal ileum, low sphincter tone   US abdomen 01/17/20: IMPRESSION: Large nonspecific area of increased hepatic parenchymal echogenicity within the right lobe of the liver which may represent focal fatty deposition. Given the overall appearance however confirmation with pre and post contrast-enhanced MRI is recommended.   Hepatic steatosis.   TTG IgA negative Total IgA 129 Pancreatic fecal elastase 112  HLA DQ2 (+), HLA DQ8 negative   MRI abdomen / pelvis 03/01/21: IMPRESSION: 1. No acute findings or explanation for the patient's symptoms. 2. Sigmoid diverticulosis without evidence of diverticulitis. 3. Mild hepatic steatosis   Anorectal manometry 12/17/21: Weak anal sphincter Rectal hyposensitivity Evidence of dyssynergia     Past Medical History:  Diagnosis Date   Alcohol abuse    Allergy    Anxiety 2015    Arthritis    Carpal tunnel syndrome, bilateral 2014   CKD (chronic kidney disease) stage 3, GFR 30-59 ml/min (HCC)    COPD (chronic obstructive pulmonary disease) ([place]) 2015   Depression 2016   hospitalized in 2019 for 1 week   Diverticulosis 09/2019   GERD (gastroesophageal reflux disease) 2007   Hepatic steatosis 2022   Hx of melanoma of skin    Hypertension    Hypothyroid    IBS (irritable bowel syndrome)    Malignant melanoma ([place])    1986 and 1987   Mixed hyperlipidemia    Osteopenia 2000   Peripheral neuropathy 2015   hands and feet   [place] spotted fever 1999   Scalp alopecia    Tubular adenoma 09/2019   Venous insufficiency      Past Surgical History:  Procedure Laterality Date   ABDOMINAL HYSTERECTOMY  1994   ANAL RECTAL MANOMETRY N/A 12/17/2021   Procedure: ANO RECTAL MANOMETRY;  Surgeon: Yetta Flock, MD;  Location: [facility];  Service: Gastroenterology;  Laterality: N/A;   BLEPHAROPLASTY Bilateral 2009   BUNIONECTOMY Right 2018   CESAREAN SECTION  1983   COLONOSCOPY W/ POLYPECTOMY  09/2019   tubular adenoma   TONSILLECTOMY AND ADENOIDECTOMY  1951   Family History  Problem Relation Age of Onset   Atrial fibrillation Mother    Arthritis Mother    Dementia Mother    Prostate cancer Father 74   CAD Father 56   Suicidality Father    Colon polyps Brother    Heart disease Paternal Grandmother    Atrial fibrillation Maternal Aunt    Heart disease Paternal Aunt    Colon cancer Neg Hx    Pancreatic cancer Neg Hx    Esophageal cancer Neg Hx    Social History   Tobacco Use   Smoking status: Former   Smokeless tobacco: Never  Scientific laboratory technician Use: Never used  Substance Use Topics   Alcohol use: Yes   Drug use: Never   Current Outpatient Medications  Medication Sig Dispense Refill   Alpha-D-Galactosidase (BEANO) TABS Take 1 tablet by mouth daily as needed.     ALPRAZolam (XANAX) 1 MG tablet Take 1 mg by mouth at bedtime.      BIOTIN 5000 PO Take 1 tablet by mouth daily.     Calcium Carbonate (CALCIUM 500 PO) Take 1 tablet by mouth daily.     cetirizine (ZYRTEC) 10 MG tablet Take 10 mg by mouth daily.     cholecalciferol (VITAMIN D) 1000 units tablet Take 1,000 Units by mouth daily.     Coenzyme Q10 (COQ10) 200 MG CAPS Take 1 tablet by mouth daily.     levothyroxine (SYNTHROID) 100 MCG tablet Take 100 mcg by mouth daily.     loperamide (IMODIUM A-D) 2 MG tablet Take 2 mg by mouth daily at 6 (six) AM. Titrate as needed     losartan (COZAAR) 50 MG tablet Take 50 mg by mouth daily.     OVER THE COUNTER MEDICATION Take 1 capsule by mouth  daily. Kidney cleanse by [employer]     OVER THE COUNTER MEDICATION Liver well - one capsule daily     Pancrelipase, Lip-Prot-Amyl, (ZENPEP) 40000-126000 units CPEP Take 2 to 3 tablets with meals 100 capsule 3   rosuvastatin (CRESTOR) 5 MG tablet Take 5 mg by mouth every other day.     Simethicone (GAS-X PO) Take 1 tablet by mouth daily.     vitamin B-12 (CYANOCOBALAMIN) 1000 MCG tablet Take 1,000 mcg by mouth daily.     No current facility-administered medications for this visit.   Allergies  Allergen Reactions   Darvon     Hallucinations   Oxycodone Hcl     Sedation    Pravastatin Other (See Comments)    myalgias   Simvastatin Other (See Comments)    Change in bowel habits     Review of Systems: All systems reviewed and negative except where noted in HPI.    Physical Exam: BP 110/60   Pulse 80   Ht _0  (1.651 m)   Wt 137 lb (62.1 kg)   BMI 22.80 kg/m  Constitutional: Pleasant,well-developed, female in no acute distress. DRE - low rectal tone and squeeze, stool in vault. Gay standby Neurological: Alert and oriented to person place and time. Skin: Skin is warm and dry. No rashes noted. Psychiatric: Normal mood and affect. Behavior is normal.   ASSESSMENT: 81 y.o. female here for assessment of the following  1. Incontinence of feces,  unspecified fecal incontinence type   2. Chronic diarrhea    Patient with significant fecal incontinence historically, with weakened pelvic floor noted on DRE and manometry.  Loose stools are much harder for her to handle and formed stools.  She has seen pelvic floor PT twice and continues to be very diligent working on these exercises, however her DRE today still shows low resting tone and weak squeeze.  I think this is driving a lot of her passive incontinence.  She is having leakage even with more bulked stools and being constipated at times lately.  She had a course of rifaximin as above which she responded pretty well to which helped normalize her bowels a bit, however sounds like she had an acute gastroenteritis and it is taken her some time to recover from that.  Now having occasional constipated stools and loose stools depending on how much Imodium she is taking.  We will try to keep her stools bulked and add Citrucel once daily to her regimen.  She can continue Imodium as needed, she wants to continue the Zen Pep although unclear if she truly has pancreatic insufficiency based on her work-up, I am not convinced this is driving her problem.  We can add back rifaximin at some point time if she finds significant benefit from this but would like to see how she does with fiber first.  I am going to refer her to [facility] colorectal surgery, Dr. Marcello Moores for consideration of InterStim evaluation or other therapies that they offer, given persistent leakage and incontinence despite a few courses of pelvic floor PT and bulking of her stools.  She is in agreement with this.   PLAN: - refer to [facility] - Dr. Marcello Moores for fecal incontinence, weak pelvic floor - continue immodium PRN - continue Zenpep - add Citrucel once daily - can add back Rifaximin if needed - she will contact me for update in a few weeks  Jolly Mango, MD [facility]

## 2022-08-04 NOTE — Patient Instructions (Signed)
_______________________________________________________  If you are age 79 or older, your body mass index should be between 23-30. Your Body mass index is 22.8 kg/m. If this is out of the aforementioned range listed, please consider follow up with your Primary Care Provider. ________________________________________________________  The [place] [facility] providers would like to encourage you to use [facility] to communicate with providers for non-urgent requests or questions.  Due to long hold times on the telephone, sending your provider a message by [facility] may be a faster and more efficient way to get a response.  Please allow 48 business hours for a response.  Please remember that this is for non-urgent requests.  _______________________________________________________  Dennis Bast have been scheduled for an appointment with ________ at [facility]. Your appointment is on ______ at _____. Please arrive at ______ for registration. Make certain to bring a list of current medications, including any over the counter medications or vitamins. Also bring your co-pay if you have one as well as your insurance cards. [facility] is located at [address], [address]. Should you need to reschedule your appointment, please contact them at [phone].  CONTINUE: Imodium CONTINUE: Zenpep  Please start taking citrucel (orange flavored) powder fiber supplement.  This may cause some bloating at first but that usually goes away. Begin with a small spoonful and work your way up to a large, heaping spoonful daily over a week.  Thank you for entrusting me with your care and choosing [facility].  Dr Havery Moros

## 2022-09-14 ENCOUNTER — Other Ambulatory Visit: Payer: Self-pay | Admitting: Gastroenterology

## 2022-09-14 ENCOUNTER — Other Ambulatory Visit: Payer: Self-pay

## 2022-09-14 ENCOUNTER — Telehealth: Payer: Self-pay | Admitting: Gastroenterology

## 2022-09-14 NOTE — Telephone Encounter (Signed)
Inbound call from patient stating that she needs a refill for ZENPEP. Please advise.

## 2022-09-14 NOTE — Telephone Encounter (Signed)
E-script approved

## 2022-09-15 ENCOUNTER — Other Ambulatory Visit: Payer: Self-pay

## 2022-09-15 MED ORDER — ZENPEP 40000-126000 UNITS PO CPEP: ORAL_CAPSULE | ORAL | 3 refills | Status: DC

## 2022-09-15 NOTE — Progress Notes (Signed)
Update script to 2 caps with meals and 1 with a snack. Patient weighs 137 lbs. New # sent to pharmacy.

## 2023-01-10 IMAGING — MR MR ABDOMEN WO/W CM
12 of 17 series · 29 of 48 positions shown · IV contrast (13ML Multihance)
Comparison: Abdominal ultrasound 01/17/2020. Abdominopelvic CT
01/21/2020.

CLINICAL DATA: Right upper quadrant abdominal pain and diarrhea.
Fecal incontinence for 2 years. History of melanoma and
hysterectomy.

EXAM:
MRI ABDOMEN AND PELVIS WITHOUT AND WITH CONTRAST
TECHNIQUE: Multiplanar multisequence MR imaging of the abdomen and pelvis was
performed both before and after the administration of intravenous
contrast.
CONTRAST:  13mL MULTIHANCE GADOBENATE DIMEGLUMINE 529 MG/ML IV SOLN

[Series 4: T2 · coronal · 5.0mm · 1.56mm/px · 1 of 30 slices shown (1 of 3)]
[im 1/30]
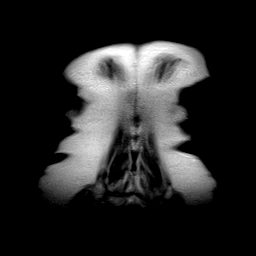

[Series 5: axial tru fisp · axial · 5.0mm · 1.45mm/px · 1 of 43 slices shown]
[im 1/43]
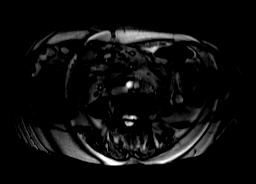

[Series 6: T2 · axial · 6.5mm · 0.74mm/px · 1 of 30 slices shown (2 of 3)]
[im 1/30]
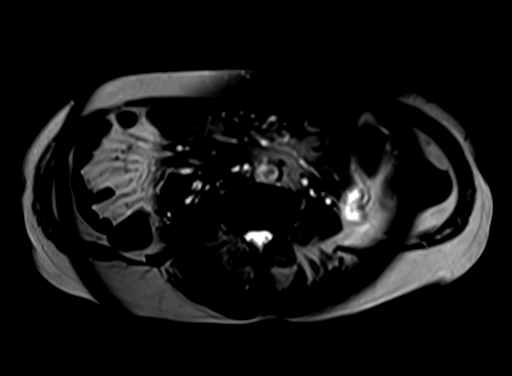

[Series 7: [id number] · axial · 6.5mm · 1.98mm/px · z∈[+60,+285]mm · 2 of 88 slices shown]
[im 1/88]
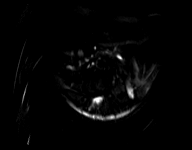
[im 88/88]
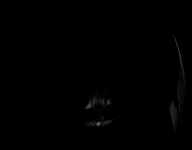

[Series 8: [id number]_adc · axial · 6.5mm · 1.98mm/px · 1 of 30 slices shown]
[im 1/30]
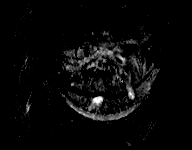

[Series 9: T2 · axial · 5.0mm · 1.45mm/px · 1 of 43 slices shown (3 of 3)]
[im 1/43]
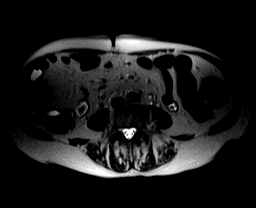

[Series 10: axial in out · axial · 5.5mm · 0.74mm/px · z∈[+50,+290]mm · 3 of 78 slices shown]
[im 1/78]
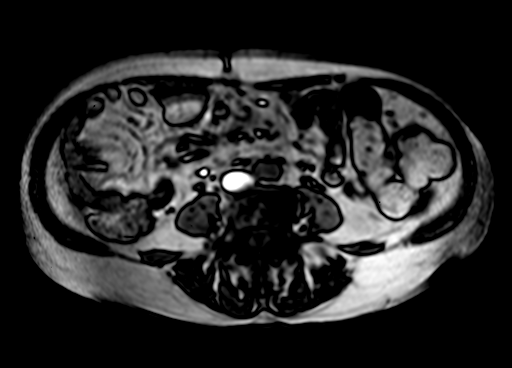
[im 39/78]
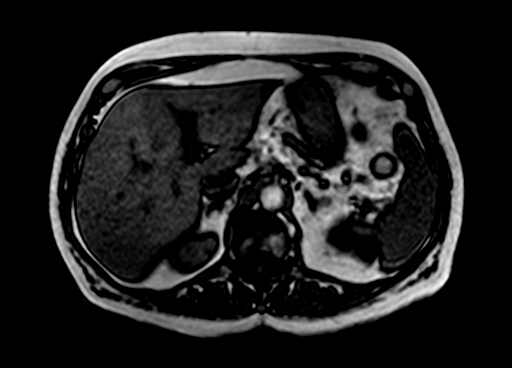
[im 78/78]
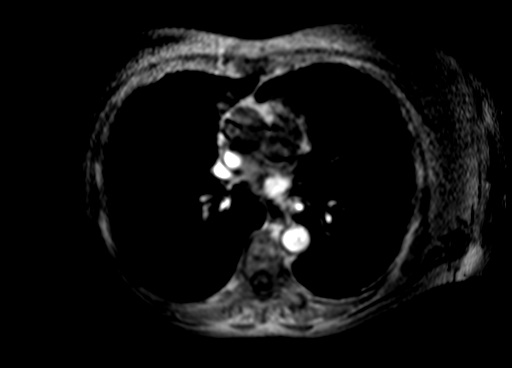

[Series 11: T1 dynamic · axial · non-contrast · 2.2mm · 0.72mm/px · z∈[+48,+291]mm · 4 of 112 slices shown]
[im 1/112]
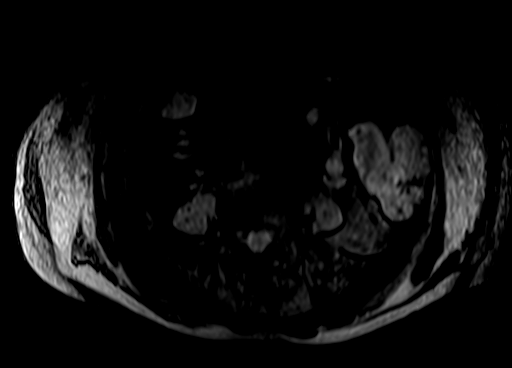
[im 38/112]
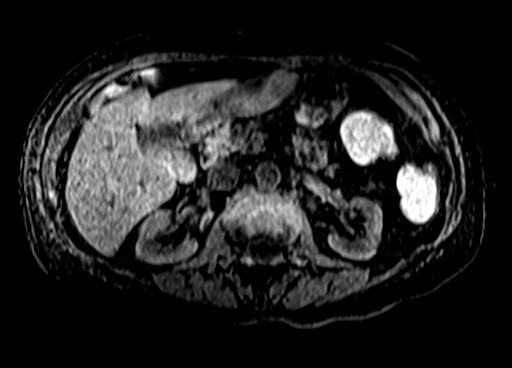
[im 75/112]
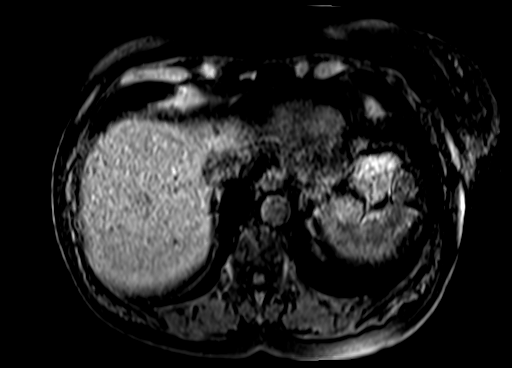
[im 112/112]
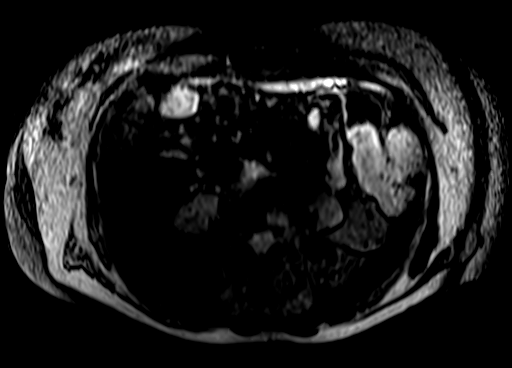

[Series 12: post 25 sec · axial · 2.2mm · 0.72mm/px · z∈[+48,+291]mm · 4 of 112 slices shown]
[im 1/112]
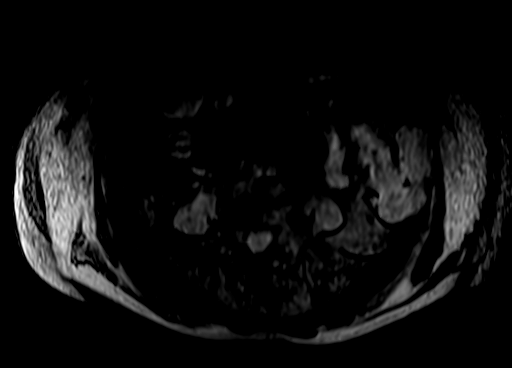
[im 38/112]
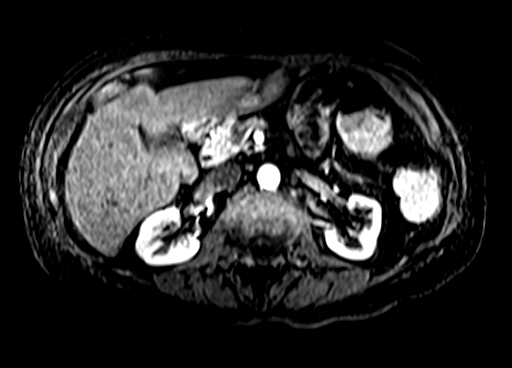
[im 75/112]
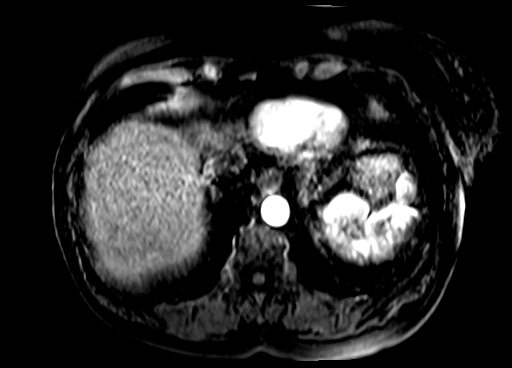
[im 112/112]
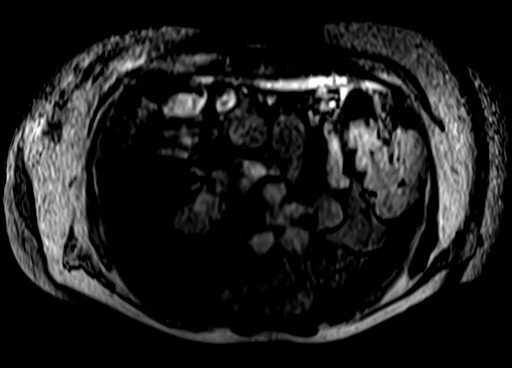

[Series 13: post 25 sec_sub · axial · 2.2mm · 0.72mm/px · z∈[+48,+291]mm · 4 of 112 slices shown]
[im 1/112]
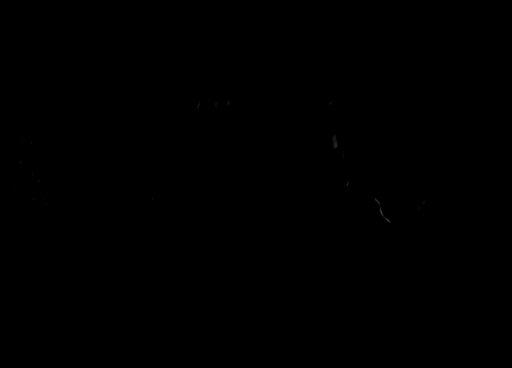
[im 38/112]
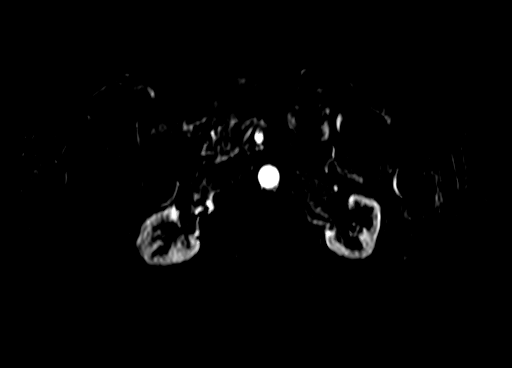
[im 75/112]
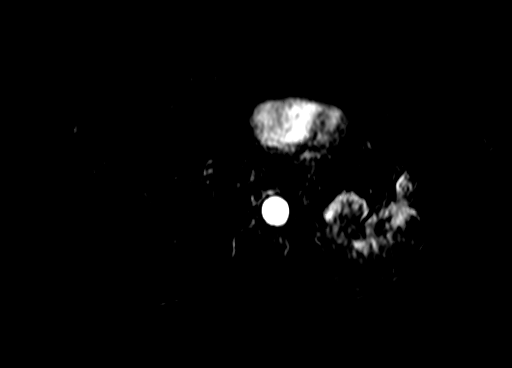
[im 112/112]
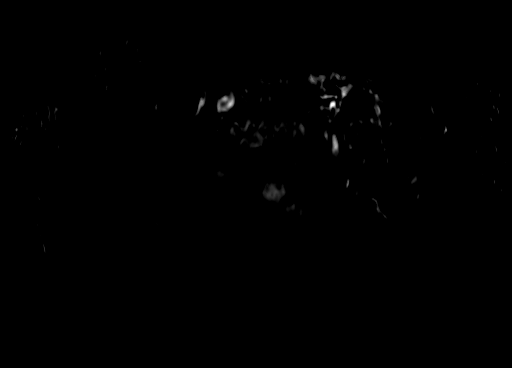

[Series 14: post 45 sec · axial · 2.2mm · 0.72mm/px · z∈[+48,+291]mm · 4 of 112 slices shown]
[im 1/112]
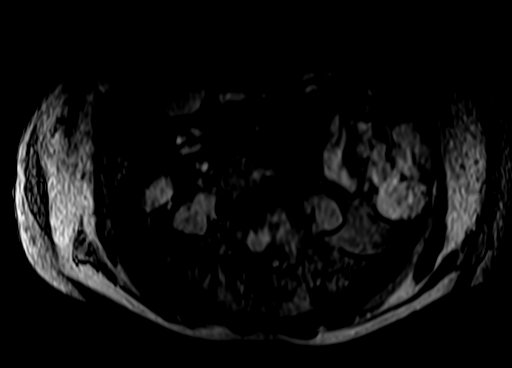
[im 38/112]
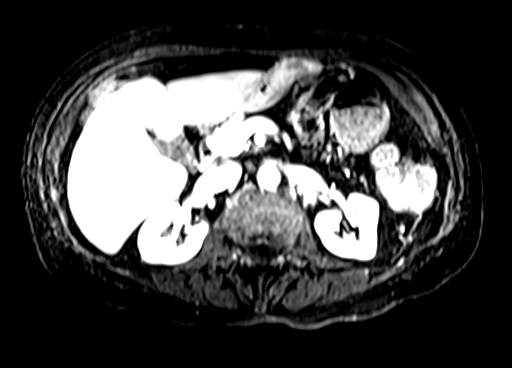
[im 75/112]
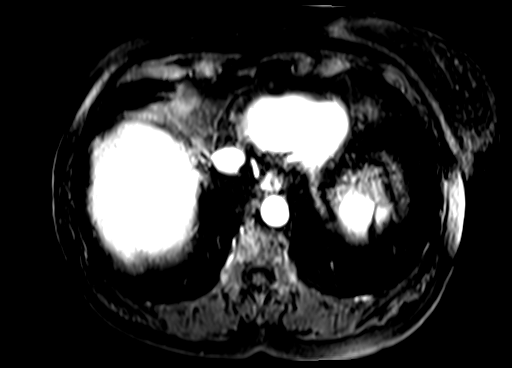
[im 112/112]
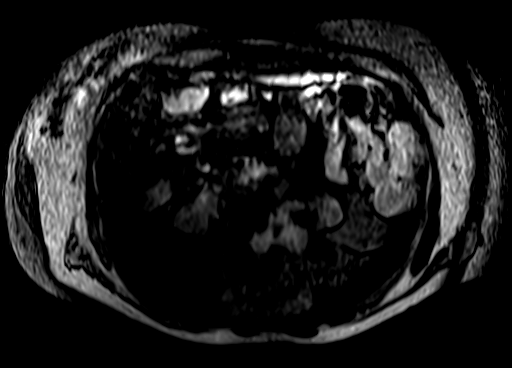

[Series 15: post 45 sec_sub · axial · 2.2mm · 0.72mm/px · z∈[+48,+210]mm · 3 of 112 slices shown]
[im 1/112]
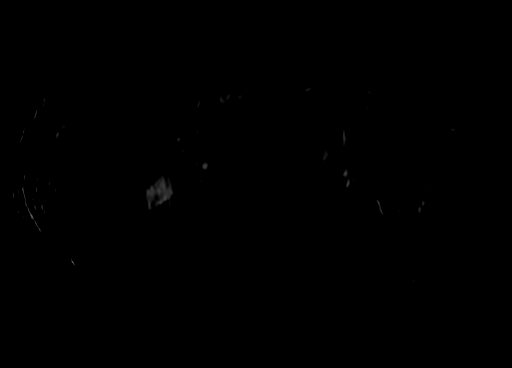
[im 38/112]
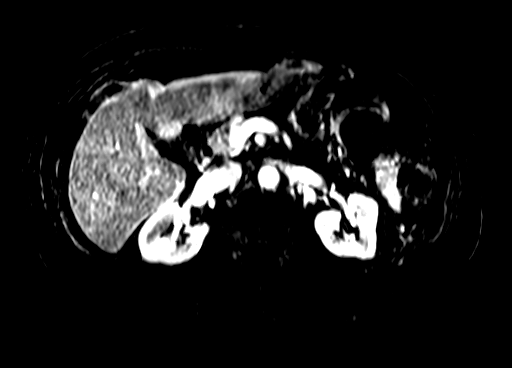
[im 75/112]
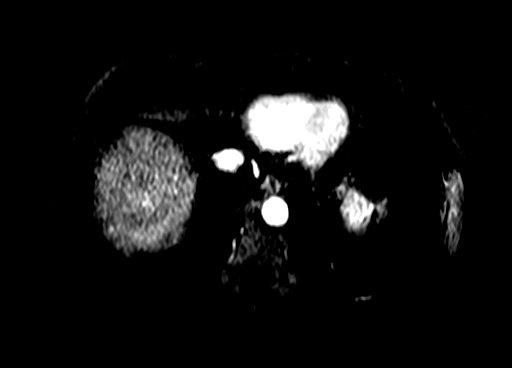

[29 of 48 positions shown; findings below may reference images not displayed]

FINDINGS: COMBINED FINDINGS FOR BOTH MR ABDOMEN AND PELVIS

Lower chest:  The visualized lower chest appears unremarkable.

Hepatobiliary: Mild hepatic steatosis with mild loss of signal on
the gradient echo opposed phase images. There is a small focus of
focal fat near the falciform ligament which appears unchanged. No
suspicious focal hepatic lesion or abnormal enhancement. No evidence
of gallstones, gallbladder wall thickening or biliary dilatation.

Pancreas: Unremarkable. No pancreatic ductal dilatation or
surrounding inflammatory changes.

Spleen: Normal in size without focal abnormality.

Adrenals/Urinary Tract: Both adrenal glands appear normal. Both
kidneys appear unremarkable, without hydronephrosis. The bladder
appears unremarkable.

Stomach/Bowel: The stomach appears unremarkable for its degree of
distention. There is mild sigmoid diverticulosis without significant
wall thickening or surrounding inflammatory change. Colonic stool
burden appears improved compared with recent CT.

Vascular/Lymphatic: There are no enlarged abdominal or pelvic lymph
nodes. No significant vascular findings.

Reproductive: Hysterectomy.  No adnexal mass.

Other: Trace free pelvic fluid, within physiologic limits. No
abnormal peritoneal enhancement identified. The abdominal wall
appears intact.

Musculoskeletal: No acute or significant osseous findings. Mild
multilevel spondylosis.
IMPRESSION: 1. No acute findings or explanation for the patient's symptoms.
2. Sigmoid diverticulosis without evidence of diverticulitis.
3. Mild hepatic steatosis.

## 2023-03-22 HISTORY — PX: MELANOMA EXCISION: SHX5266

## 2023-04-13 ENCOUNTER — Other Ambulatory Visit: Payer: Self-pay | Admitting: Gastroenterology

## 2023-04-13 ENCOUNTER — Other Ambulatory Visit: Payer: Self-pay

## 2023-04-13 NOTE — Progress Notes (Signed)
Zenpep refill sent

## 2023-12-13 NOTE — Progress Notes (Unsigned)
HPI : seen 73/6510  83 year old female here for follow-up visit for chronic diarrhea and fecal incontinence. I saw her most recently in July.   See below for extensive work-up, including colonoscopy, anorectal manometry, serology, stool studies.  She is thought to have weak pelvic floor in the setting of loose stools causing her incontinence. We screened her for celiac disease with serology which was negative.  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  Fecal pancreatic elastase which was low at 112. She was started on Zenpep. Imaging of her pancreas was normal.    Over time we stopped her oral magnesium supplement, she failed colestid / cholestyramine, immodium monotherapy. Low FODMAP diet has helped.  She has most recently been treated with higher dosing of pancreatic enzymes (Zenpep), immodium, low FODMAP diet, and a course of rifaximin, in addition to being treated by pelvic floor PT. We had discussed a referral to colorectal surgery for possible interstim evaluation, she had not had that done yet.    She previously had a very good response to rifaximin so after our last visit we gave her another 2-week course of it.  She states while it took some time to "kick in" it definitely made a difference and she was feeling much better on it.  She had also stopped probiotics.  While her bowel habits were much better she was still having some occasional leakage of stool, especially while walking.  She states she has continued to do pelvic floor PT, she has seen them on 2 separate consultations, she was discharged early from her last visit for "improvement".   Unfortunately the patient had a setback on August 22, states she ate some macaroni and cheese and shortly thereafter had severe nausea and vomiting with diarrhea that lasted for several hours and then resolved.  Since then her bowels have taken some time to get back to normal.  She has [place] some [place] folk  [place] and she states she had significant fecal incontinence that is passive and she can't control it.  She has been using Imodium more frequently to bulk up her stool but this actually caused some constipation.  She typically takes 1 Imodium per day, increase to 1-1/2 tabs a day and she thinks it is a bit too strong, if she only takes half pill a day her stool remains on the looser side, she is very sensitive to this.  She has been taking 2 Zenpep's with each meal and she does think there is a component of improvement with the Zenpep but she does not see any stearrhea or problems like that.   The passive incontinence and leakage is her main concern.  We discussed potential surgical options.     Prior workup has included.  TSH normal ESR normal CBC normal, no anemia or leukocytosis LFTs normal   Abdominal x ray - 03/11/20 - large stool burden   CT abdomen / pelvis 01/21/20 - moderate stool burden in right and transverse colon suggestive of constipation, liver is NORMAL   Colonoscopy 09/27/19 - one diminutive adenoma, random biopsies showed no evidence of microscopic colitis, normal ileum, low sphincter tone   US abdomen 01/17/20: IMPRESSION: Large nonspecific area of increased hepatic parenchymal echogenicity within the right lobe of the liver which may represent focal fatty deposition. Given the overall appearance however confirmation with pre and post contrast-enhanced MRI is recommended.   Hepatic steatosis.   TTG IgA negative Total IgA  129 Pancreatic fecal elastase 112 HLA DQ2 (+), HLA DQ8 negative   MRI abdomen / pelvis 03/01/21: IMPRESSION: 1. No acute findings or explanation for the patient's symptoms. 2. Sigmoid diverticulosis without evidence of diverticulitis. 3. Mild hepatic steatosis   Anorectal manometry 12/17/21: Weak anal sphincter Rectal hyposensitivity Evidence of dyssynergia     83 y.o. female here for assessment of the following   1. Incontinence of  feces, unspecified fecal incontinence type   2. Chronic diarrhea     Patient with significant fecal incontinence historically, with weakened pelvic floor noted on DRE and manometry.  Loose stools are much harder for her to handle and formed stools.  She has seen pelvic floor PT twice and continues to be very diligent working on these exercises, however her DRE today still shows low resting tone and weak squeeze.  I think this is driving a lot of her passive incontinence.  She is having leakage even with more bulked stools and being constipated at times lately.  She had a course of rifaximin as above which she responded pretty well to which helped normalize her bowels a bit, however sounds like she had an acute gastroenteritis and it is taken her some time to recover from that.  Now having occasional constipated stools and loose stools depending on how much Imodium she is taking.  We will try to keep her stools bulked and add Citrucel once daily to her regimen.  She can continue Imodium as needed, she wants to continue the Zen Pep although unclear if she truly has pancreatic insufficiency based on her work-up, I am not convinced this is driving her problem.  We can add back rifaximin at some point time if she finds significant benefit from this but would like to see how she does with fiber first.  I am going to refer her to [facility] colorectal surgery, Dr. Maisie Fus for consideration of InterStim evaluation or other therapies that they offer, given persistent leakage and incontinence despite a few courses of pelvic floor PT and bulking of her stools.  She is in agreement with this.     PLAN: - refer to [facility] - Dr. Maisie Fus for fecal incontinence, weak pelvic floor - continue immodium PRN - continue Zenpep - add Citrucel once daily - can add back Rifaximin if needed - she will contact me for update in a few weeks  Referred to Dr. Drue Dun They discussed options and decided to hold off     Past Medical History:   Diagnosis Date   Alcohol abuse    Allergy    Anxiety 2015   Arthritis    Carpal tunnel syndrome, bilateral 2014   CKD (chronic kidney disease) stage 3, GFR 30-59 ml/min (HCC)    COPD (chronic obstructive pulmonary disease) (HCC) 2015   Depression 2016   hospitalized in 2019 for 1 week   Diverticulosis 09/2019   GERD (gastroesophageal reflux disease) 2007   Hepatic steatosis 2022   Hx of melanoma of skin    Hypertension    Hypothyroid    IBS (irritable bowel syndrome)    Malignant melanoma (HCC)    1986 and 1987   Mixed hyperlipidemia    Osteopenia 2000   Peripheral neuropathy 2015   hands and feet   [place] spotted fever 1999   Scalp alopecia    Tubular adenoma 09/2019   Venous insufficiency      Past Surgical History:  Procedure Laterality Date   ABDOMINAL HYSTERECTOMY  1994   ANAL  RECTAL MANOMETRY N/A 12/17/2021   Procedure: ANO RECTAL MANOMETRY;  Surgeon: Benancio Deeds, MD;  Location: [facility];  Service: Gastroenterology;  Laterality: N/A;   BLEPHAROPLASTY Bilateral 2009   BUNIONECTOMY Right 2018   CESAREAN SECTION  1983   COLONOSCOPY W/ POLYPECTOMY  09/2019   tubular adenoma   TONSILLECTOMY AND ADENOIDECTOMY  1951   Family History  Problem Relation Age of Onset   Atrial fibrillation Mother    Arthritis Mother    Dementia Mother    Prostate cancer Father 36   CAD Father 59   Suicidality Father    Colon polyps Brother    Heart disease Paternal Grandmother    Atrial fibrillation Maternal Aunt    Heart disease Paternal Aunt    Colon cancer Neg Hx    Pancreatic cancer Neg Hx    Esophageal cancer Neg Hx    Social History   Tobacco Use   Smoking status: Former   Smokeless tobacco: Never  Vaping Use   Vaping status: Never Used  Substance Use Topics   Alcohol use: Yes   Drug use: Never   Current Outpatient Medications  Medication Sig Dispense Refill   Alpha-D-Galactosidase (BEANO) TABS Take 1 tablet by mouth daily as needed.      ALPRAZolam (XANAX) 1 MG tablet Take 1 mg by mouth at bedtime.     BIOTIN 5000 PO Take 1 tablet by mouth daily.     Calcium Carbonate (CALCIUM 500 PO) Take 1 tablet by mouth daily.     cetirizine (ZYRTEC) 10 MG tablet Take 10 mg by mouth daily.     cholecalciferol (VITAMIN D) 1000 units tablet Take 1,000 Units by mouth daily.     Coenzyme Q10 (COQ10) 200 MG CAPS Take 1 tablet by mouth daily.     levothyroxine (SYNTHROID) 100 MCG tablet Take 100 mcg by mouth daily.     loperamide (IMODIUM A-D) 2 MG tablet Take 2 mg by mouth daily at 6 (six) AM. Titrate as needed     losartan (COZAAR) 50 MG tablet Take 50 mg by mouth daily.     OVER THE COUNTER MEDICATION Take 1 capsule by mouth daily. Kidney cleanse by [employer]     OVER THE COUNTER MEDICATION Liver well - one capsule daily     Pancrelipase, Lip-Prot-Amyl, (ZENPEP) 40000-126000 units CPEP TAKE 2 CAPSULES WITH MEALS and 1 CAPSULE WITH SNACKS. Please schedule a follow up appointment for further refills.  Thank you 240 capsule 2   rosuvastatin (CRESTOR) 5 MG tablet Take 5 mg by mouth every other day.     Simethicone (GAS-X PO) Take 1 tablet by mouth daily.     vitamin B-12 (CYANOCOBALAMIN) 1000 MCG tablet Take 1,000 mcg by mouth daily.     No current facility-administered medications for this visit.   Allergies  Allergen Reactions   Darvon     Hallucinations   Oxycodone Hcl     Sedation    Pravastatin Other (See Comments)    myalgias   Simvastatin Other (See Comments)    Change in bowel habits     Review of Systems: All systems reviewed and negative except where noted in HPI.    No results found.  Physical Exam: There were no vitals taken for this visit. Constitutional: Pleasant,well-developed, ***female in no acute distress. HEENT: Normocephalic and atraumatic. Conjunctivae are normal. No scleral icterus. Neck supple.  Cardiovascular: Normal rate, regular rhythm.  Pulmonary/chest: Effort normal and breath sounds normal. No  wheezing, rales or  rhonchi. Abdominal: Soft, nondistended, nontender. Bowel sounds active throughout. There are no masses palpable. No hepatomegaly. Extremities: no edema Lymphadenopathy: No cervical adenopathy noted. Neurological: Alert and oriented to person place and time. Skin: Skin is warm and dry. No rashes noted. Psychiatric: Normal mood and affect. Behavior is normal.   ASSESSMENT: 83 y.o. female here for assessment of the following  No diagnosis found.  PLAN:   Mosetta Putt, MD

## 2023-12-14 ENCOUNTER — Ambulatory Visit: Payer: Medicare PPO | Admitting: Gastroenterology

## 2023-12-14 ENCOUNTER — Encounter: Payer: Self-pay | Admitting: Gastroenterology

## 2023-12-14 VITALS — BP 108/74 | HR 76 | Ht 65.0 in | Wt 138.2 lb

## 2023-12-14 DIAGNOSIS — R159 Full incontinence of feces: Secondary | ICD-10-CM

## 2023-12-14 DIAGNOSIS — K219 Gastro-esophageal reflux disease without esophagitis: Secondary | ICD-10-CM | POA: Diagnosis not present

## 2023-12-14 DIAGNOSIS — R140 Abdominal distension (gaseous): Secondary | ICD-10-CM | POA: Diagnosis not present

## 2023-12-14 DIAGNOSIS — K529 Noninfective gastroenteritis and colitis, unspecified: Secondary | ICD-10-CM | POA: Diagnosis not present

## 2023-12-14 DIAGNOSIS — Z860100 Personal history of colon polyps, unspecified: Secondary | ICD-10-CM

## 2023-12-14 NOTE — Patient Instructions (Addendum)
We are giving you a FODZYME  handout today.  Continue Zenpep and Pepcid.  Please keep [place] updated on how you are doing.  Thank you for entrusting me with your care and for choosing [facility], Dr. Ileene Patrick    If your blood pressure at your visit was 140/90 or greater, please contact your primary care physician to follow up on this. ______________________________________________________  If you are age 81 or older, your body mass index should be between 23-30. Your Body mass index is 23.01 kg/m. If this is out of the aforementioned range listed, please consider follow up with your Primary Care Provider.  If you are age 41 or younger, your body mass index should be between 19-25. Your Body mass index is 23.01 kg/m. If this is out of the aformentioned range listed, please consider follow up with your Primary Care Provider.  ________________________________________________________  The [place] [facility] providers would like to encourage you to use [facility] to communicate with providers for non-urgent requests or questions.  Due to long hold times on the telephone, sending your provider a message by [facility] may be a faster and more efficient way to get a response.  Please allow 48 business hours for a response.  Please remember that this is for non-urgent requests.  _______________________________________________________  Due to recent changes in healthcare laws, you may see the results of your imaging and laboratory studies on MyChart before your provider has had a chance to review them.  We understand that in some cases there may be results that are confusing or concerning to you. Not all laboratory results come back in the same time frame and the provider may be waiting for multiple results in order to interpret others.  Please give [place] 48 hours in order for your provider to thoroughly review all the results before contacting the office for clarification of your results.

## 2024-01-17 ENCOUNTER — Other Ambulatory Visit: Payer: Self-pay | Admitting: Gastroenterology

## 2024-02-27 ENCOUNTER — Telehealth: Payer: Self-pay | Admitting: Podiatry

## 2024-02-27 NOTE — Telephone Encounter (Signed)
 See notes
